# Patient Record
Sex: Male | Born: 1975 | Race: White | Hispanic: No | Marital: Single | Smoking: Never smoker
Health system: Southern US, Community
[De-identification: ages and names within clinical notes are randomized; demographics above are authoritative.]

## PROBLEM LIST (undated history)

## (undated) DIAGNOSIS — Z6825 Body mass index (BMI) 25.0-25.9, adult: Secondary | ICD-10-CM

## (undated) DIAGNOSIS — R9431 Abnormal electrocardiogram [ECG] [EKG]: Secondary | ICD-10-CM

## (undated) DIAGNOSIS — F419 Anxiety disorder, unspecified: Secondary | ICD-10-CM

## (undated) DIAGNOSIS — R911 Solitary pulmonary nodule: Secondary | ICD-10-CM

## (undated) DIAGNOSIS — R945 Abnormal results of liver function studies: Secondary | ICD-10-CM

## (undated) HISTORY — DX: Abnormal electrocardiogram (ECG) (EKG): R94.31

## (undated) HISTORY — DX: Anxiety disorder, unspecified: F41.9

## (undated) HISTORY — DX: Body mass index (BMI) 25.0-25.9, adult: Z68.25

## (undated) HISTORY — DX: Solitary pulmonary nodule: R91.1

## (undated) HISTORY — PX: HERNIA REPAIR: SHX51

## (undated) HISTORY — DX: Abnormal results of liver function studies: R94.5

---

## 1998-06-22 HISTORY — PX: HIP SURGERY: SHX245

## 2014-09-21 ENCOUNTER — Ambulatory Visit (INDEPENDENT_AMBULATORY_CARE_PROVIDER_SITE_OTHER): Payer: Self-pay | Admitting: Internal Medicine

## 2014-09-21 ENCOUNTER — Encounter: Payer: Self-pay | Admitting: Internal Medicine

## 2014-09-21 VITALS — BP 126/74 | HR 87 | Temp 98.7°F | Resp 17 | Ht 70.5 in | Wt 172.0 lb

## 2014-09-21 DIAGNOSIS — E039 Hypothyroidism, unspecified: Secondary | ICD-10-CM

## 2014-09-21 DIAGNOSIS — F411 Generalized anxiety disorder: Secondary | ICD-10-CM

## 2014-09-21 MED ORDER — CLONAZEPAM 1 MG PO TABS: 0.5000 mg | ORAL_TABLET | Freq: Two times a day (BID) | ORAL | Status: DC | PRN

## 2014-09-21 NOTE — Progress Notes (Signed)
Subjective:  This chart was scribed for Dr. Ellamae Siaobert Doolittle, MD  by Abel PrestoKara Demonbreun, ED Scribe. This patient was seen in room 10 and the patient's care was started at 1:05 PM.    Patient ID: Shawn Hutchinson, male    DOB: 11-04-1975, 39 y.o.   MRN: [id number]  HPI  Chief Complaint  Patient presents with   Medication Refill    clonopin    HPI Comments: Shawn SilvasJohn Hutchinson is a 39 y.o. male who presents to the [facility] complaining of medication refill of clonopin. Pt's PCP is at [facility]. Pt states PCP is unable to see him soon.Pt with h/o general anxiety disorder with onset in his 30s. Pt denies specific triggers of his panic attacks. Pt states panic attacks often happen at night disturbing pt's sleep. Pt takes melatonin to help him sleep but states it has not been working for him. Pt notes increase in recent family and work stressors. Pt is currently seeing a counselor and states it helps some. Pt has taken Zoloft in past. Pt takes synthroid but notes hypothyroidism is controlled.  Pt denies depression and dysphoric mood.   No past medical history on file.   No past surgical history on file.   History   Social History   Marital Status: Single    Spouse Name: N/A   Number of Children: N/A   Years of Education: N/A   Occupational History   Not on file.   Social History Main Topics   Smoking status: Never Smoker    Smokeless tobacco: Not on file   Alcohol Use: Not on file   Drug Use: Not on file   Sexual Activity: Not on file   Other Topics Concern   Not on file   Social History Narrative   No narrative on file   No Known Allergies    Current outpatient prescriptions:    levothyroxine (SYNTHROID, LEVOTHROID) 100 MCG tablet, Take 100 mcg by mouth daily before breakfast., Disp: , Rfl:    clonazePAM (KLONOPIN) 1 MG tablet, Take 0.5-1 tablets (0.5-1 mg total) by mouth 2 (two) times daily as needed for anxiety., Disp: 60 tablet, Rfl:  5   Review of Systems  Constitutional: Negative for activity change.  Psychiatric/Behavioral: Positive for sleep disturbance. Negative for dysphoric mood. The patient is nervous/anxious.       Objective:   Physical Exam  Constitutional: He is oriented to person, place, and time. He appears well-developed and well-nourished.  HENT:  Head: Normocephalic.  Eyes: Conjunctivae are normal.  Neck: Normal range of motion. Neck supple.  Pulmonary/Chest: Effort normal.  Musculoskeletal: Normal range of motion.  Neurological: He is alert and oriented to person, place, and time.  Skin: Skin is warm and dry.  Psychiatric: He has a normal mood and affect. His behavior is normal. Judgment and thought content normal.  Nursing note and vitals reviewed.     Filed Vitals:   09/21/14 1232  BP: 126/74  Pulse: 87  Temp: 98.7 F (37.1 C)  TempSrc: Oral  Resp: 17  Height: 5' 10.5" (1.791 m)  Weight: 172 lb (78.019 kg)  SpO2: 97%    Assessment & Plan:  GAD (generalized anxiety disorder) - Plan: clonazePAM (KLONOPIN) 1 MG tablet  He is currently doing well despite his inherited problems and needs occasional benzodiazepine to remain effective in daily life. Because he uses this on less than a One-A-Day average he should not be in danger of becoming tolerant. Because  he continues with counseling there is no need to prescribe SSRI at this point. He may follow-up here in 6 months and will consider transition to [place] for full care.     Meds ordered this encounter  Medications   levothyroxine (SYNTHROID, LEVOTHROID) 100 MCG tablet    Sig: Take 100 mcg by mouth daily before breakfast.   clonazePAM (KLONOPIN) 1 MG tablet    Sig: Take 0.5-1 tablets (0.5-1 mg total) by mouth 2 (two) times daily as needed for anxiety.    Dispense:  60 tablet    Refill:  5          I have completed the patient encounter in its entirety as documented by the scribe, with editing by me where necessary. Robert P.  Merla Riches, M.D.

## 2014-09-22 DIAGNOSIS — E039 Hypothyroidism, unspecified: Secondary | ICD-10-CM

## 2014-09-22 DIAGNOSIS — F411 Generalized anxiety disorder: Secondary | ICD-10-CM

## 2014-09-22 HISTORY — DX: Generalized anxiety disorder: F41.1

## 2014-09-22 HISTORY — DX: Hypothyroidism, unspecified: E03.9

## 2016-05-20 DIAGNOSIS — E782 Mixed hyperlipidemia: Secondary | ICD-10-CM

## 2016-05-20 HISTORY — DX: Mixed hyperlipidemia: E78.2

## 2018-12-30 DIAGNOSIS — E039 Hypothyroidism, unspecified: Secondary | ICD-10-CM

## 2018-12-30 DIAGNOSIS — K2921 Alcoholic gastritis with bleeding: Secondary | ICD-10-CM

## 2018-12-30 DIAGNOSIS — F418 Other specified anxiety disorders: Secondary | ICD-10-CM

## 2018-12-30 DIAGNOSIS — E873 Alkalosis: Secondary | ICD-10-CM

## 2018-12-30 DIAGNOSIS — E871 Hypo-osmolality and hyponatremia: Secondary | ICD-10-CM

## 2018-12-30 DIAGNOSIS — F1010 Alcohol abuse, uncomplicated: Secondary | ICD-10-CM

## 2018-12-30 DIAGNOSIS — F419 Anxiety disorder, unspecified: Secondary | ICD-10-CM

## 2018-12-30 DIAGNOSIS — R251 Tremor, unspecified: Secondary | ICD-10-CM

## 2019-05-04 DIAGNOSIS — R911 Solitary pulmonary nodule: Secondary | ICD-10-CM | POA: Insufficient documentation

## 2019-05-04 DIAGNOSIS — K4020 Bilateral inguinal hernia, without obstruction or gangrene, not specified as recurrent: Secondary | ICD-10-CM

## 2019-05-04 HISTORY — DX: Bilateral inguinal hernia, without obstruction or gangrene, not specified as recurrent: K40.20

## 2020-01-22 DIAGNOSIS — F332 Major depressive disorder, recurrent severe without psychotic features: Secondary | ICD-10-CM

## 2020-01-22 HISTORY — DX: Major depressive disorder, recurrent severe without psychotic features: F33.2

## 2020-01-24 DIAGNOSIS — F1020 Alcohol dependence, uncomplicated: Secondary | ICD-10-CM | POA: Insufficient documentation

## 2020-01-24 HISTORY — DX: Alcohol dependence, uncomplicated: F10.20

## 2020-01-25 DIAGNOSIS — M542 Cervicalgia: Secondary | ICD-10-CM

## 2020-01-25 HISTORY — DX: Cervicalgia: M54.2

## 2020-02-29 DIAGNOSIS — G992 Myelopathy in diseases classified elsewhere: Secondary | ICD-10-CM

## 2020-02-29 HISTORY — DX: Myelopathy in diseases classified elsewhere: G99.2

## 2020-05-28 ENCOUNTER — Other Ambulatory Visit: Payer: Self-pay | Admitting: Neurosurgery

## 2020-06-22 HISTORY — PX: ANTERIOR CERVICAL DISCECTOMY: SHX1160

## 2020-07-04 ENCOUNTER — Other Ambulatory Visit (HOSPITAL_COMMUNITY): Payer: Self-pay

## 2020-07-04 ENCOUNTER — Inpatient Hospital Stay (HOSPITAL_COMMUNITY): Admission: RE | Admit: 2020-07-04 | Payer: Self-pay | Source: Ambulatory Visit

## 2020-07-08 ENCOUNTER — Ambulatory Visit (HOSPITAL_COMMUNITY): Admission: RE | Admit: 2020-07-08 | Payer: 59 | Source: Home / Self Care | Admitting: Neurosurgery

## 2020-07-08 ENCOUNTER — Encounter (HOSPITAL_COMMUNITY): Admission: RE | Payer: Self-pay | Source: Home / Self Care

## 2020-07-08 SURGERY — ANTERIOR CERVICAL DECOMPRESSION/DISCECTOMY FUSION 2 LEVELS
Anesthesia: General

## 2020-07-30 ENCOUNTER — Encounter (INDEPENDENT_AMBULATORY_CARE_PROVIDER_SITE_OTHER): Payer: 59 | Admitting: Ophthalmology

## 2020-08-06 ENCOUNTER — Encounter (INDEPENDENT_AMBULATORY_CARE_PROVIDER_SITE_OTHER): Payer: 59 | Admitting: Ophthalmology

## 2020-08-14 DIAGNOSIS — I10 Essential (primary) hypertension: Secondary | ICD-10-CM | POA: Insufficient documentation

## 2020-08-14 HISTORY — DX: Essential (primary) hypertension: I10

## 2020-08-19 ENCOUNTER — Ambulatory Visit (INDEPENDENT_AMBULATORY_CARE_PROVIDER_SITE_OTHER): Payer: 59 | Admitting: Ophthalmology

## 2020-08-19 ENCOUNTER — Encounter (INDEPENDENT_AMBULATORY_CARE_PROVIDER_SITE_OTHER): Payer: Self-pay | Admitting: Ophthalmology

## 2020-08-19 ENCOUNTER — Other Ambulatory Visit: Payer: Self-pay

## 2020-08-19 DIAGNOSIS — H30001 Unspecified focal chorioretinal inflammation, right eye: Secondary | ICD-10-CM

## 2020-08-19 HISTORY — DX: Unspecified focal chorioretinal inflammation, right eye: H30.001

## 2020-08-19 MED ORDER — SULFAMETHOXAZOLE-TRIMETHOPRIM 800-160 MG PO TABS: 1.0000 | ORAL_TABLET | Freq: Two times a day (BID) | ORAL | 6 refills | Status: DC

## 2020-08-19 NOTE — Progress Notes (Signed)
08/19/2020     CHIEF COMPLAINT Patient presents for Retina Evaluation (NP subretinal lesion OD - Ref'd Dr. Bolivar Haw reports floaters OU off and on x 3 months. Pt reports flashes OD off and on x 3 months. Pt sts he feels OD pupil is larger than OS. Pt c/o blur OD x 1-2 months, while driving or looking at the computer. Pt sts VA OD is like looking through wax paper.)   HISTORY OF PRESENT ILLNESS: Shawn Hutchinson is a 45 y.o. male who presents to the clinic today for:   HPI    Retina Evaluation    In right eye.  This started 3 months ago.  Duration of 3 months.  Associated Symptoms Floaters.  Negative for Flashes.  Context:  distance vision and mid-range vision.  Treatments tried include no treatments. Additional comments: NP subretinal lesion OD - Ref'd Dr. Dione Booze  Pt reports floaters OU off and on x 3 months. Pt reports flashes OD off and on x 3 months. Pt sts he feels OD pupil is larger than OS. Pt c/o blur OD x 1-2 months, while driving or looking at the computer. Pt sts VA OD is like looking through wax paper.       Last edited by [name], COA on 08/19/2020  3:15 PM. (History)      Referring physician: [name], NP [address] [place],  [place] [address]  HISTORICAL INFORMATION:   Selected notes from the MEDICAL RECORD NUMBER       CURRENT MEDICATIONS: No current outpatient medications on file. (Ophthalmic Drugs)   No current facility-administered medications for this visit. (Ophthalmic Drugs)   Current Outpatient Medications (Other)  Medication Sig  . sulfamethoxazole-trimethoprim (BACTRIM DS) 800-160 MG tablet Take 1 tablet by mouth 2 (two) times daily.  . cholecalciferol (VITAMIN D3) 25 MCG (1000 UNIT) tablet Take 1,000 Units by mouth daily.  . folic acid (FOLVITE) 1 MG tablet Take 1 mg by mouth daily.  Marland Kitchen gabapentin (NEURONTIN) 100 MG capsule Take 100 mg by mouth 3 (three) times daily.  Marland Kitchen levothyroxine (SYNTHROID) 150 MCG tablet Take 150 mcg by  mouth daily before breakfast.  . melatonin 3 MG TABS tablet Take 3 mg by mouth at bedtime as needed (sleep).  . Multiple Vitamins-Minerals (MULTIVITAMIN WITH MINERALS) tablet Take 1 tablet by mouth daily.  . sertraline (ZOLOFT) 100 MG tablet Take 100 mg by mouth daily.  Marland Kitchen thiamine 100 MG tablet Take 100 mg by mouth daily.  . traZODone (DESYREL) 50 MG tablet Take 25-50 mg by mouth at bedtime.   No current facility-administered medications for this visit. (Other)      REVIEW OF SYSTEMS:    ALLERGIES No Known Allergies  PAST MEDICAL HISTORY History reviewed. No pertinent past medical history. History reviewed. No pertinent surgical history.  FAMILY HISTORY History reviewed. No pertinent family history.  SOCIAL HISTORY Social History   Tobacco Use  . Smoking status: Never Smoker  . Smokeless tobacco: Former Neurosurgeon         OPHTHALMIC EXAM:  Base Eye Exam    Visual Acuity (ETDRS)      Right Left   Dist [place] 20/20 20/20       Tonometry (Tonopen, 3:15 PM)      Right Left   Pressure 16 13       Pupils      Dark Light Shape React APD   Right 8 7 Round Brisk None   Left 7 6  Round Brisk None       Visual Fields (Counting fingers)      Left Right    Full Full       Extraocular Movement      Right Left    Full Full       Neuro/Psych    Oriented x3: Yes   Mood/Affect: Normal       Dilation    Both eyes: 1.0% Mydriacyl, 2.5% Phenylephrine @ 3:19 PM        Slit Lamp and Fundus Exam    External Exam      Right Left   External Normal Normal       Slit Lamp Exam      Right Left   Lids/Lashes Normal Normal   Conjunctiva/Sclera White and quiet White and quiet   Cornea Clear Clear   Anterior Chamber No cells in the anterior chamber Deep and quiet   Iris Round and reactive Round and reactive   Lens Trace Nuclear sclerosis Trace Nuclear sclerosis   Anterior Vitreous 2+ Cells Normal       Fundus Exam      Right Left   Posterior Vitreous Normal Normal    Disc Normal Normal   C/D Ratio 0.4 0.45   Macula Normal Normal   Vessels Overlying chorioretinal lesion superonasal, boxcarring of vein suggesting vasculitis secondary to retinal choroiditis Normal   Periphery 4 DA Size Retinochoroiditis, Superonasal to nerve  Normal          IMAGING AND PROCEDURES  Imaging and Procedures for 08/19/20  OCT, Retina - OU - Both Eyes       Right Eye Quality was good. Scan locations included subfoveal. Central Foveal Thickness: 287. Findings include normal foveal contour.   Left Eye Quality was good. Scan locations included subfoveal. Central Foveal Thickness: 281. Progression has no prior data. Findings include normal foveal contour.   Notes Vitreous debris prefoveal       Color Fundus Photography Optos - OU - Both Eyes       Right Eye Progression has been stable. Disc findings include normal observations. Macula : normal observations. Vessels : normal observations.   Left Eye Progression has been stable. Vessels : normal observations. Periphery : normal observations.   Notes Retinal choroiditis approximately 4-5 disc areas in size superonasal to the nerve, no an imminent threat to the nerve words functioning.                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  Nonetheless he has a history of some years ago having had a brief spell of having a pet cat.  He has had no previous documented retinochoroiditis in either eye.        ICD-10-CM   1. Focal chorioretinitis and focal retinochoroiditis, right  H30.001 OCT, Retina - OU - Both Eyes    Color Fundus Photography Optos - OU - Both Eyes    CBC With Differential    Toxoplasma gondii antibody, IgM    Toxocara Ab  (IgG), Serum    C-reactive protein    QuantiFERON-TB Gold Plus    Angiotensin converting enzyme    1.  Laboratory evaluation will be undertaken to look for IgG retinochoroiditis related to toxoplasmosis.  This is  the most likely clinical diagnosis nonetheless we will look for laboratory confirmation.  In the meantime we will use Bactrim DS 1 tablet twice daily for a period of 10 days with multiple refills should in fact the serology come back positive for this condition.  2.  3.  Ophthalmic Meds Ordered this visit:  Meds ordered this encounter  Medications  . sulfamethoxazole-trimethoprim (BACTRIM DS) 800-160 MG tablet    Sig: Take 1 tablet by mouth 2 (two) times daily.    Dispense:  20 tablet    Refill:  6       Return in about 2 weeks (around 09/02/2020) for dilate, OD, COLOR FP.  There are no Patient Instructions on file for this visit.   Explained the diagnoses, plan, and follow up with the patient and they expressed understanding.  Patient expressed understanding of the importance of proper follow up care.   Alford Highland Javon Hupfer M.D. Diseases & Surgery of the Retina and Vitreous [facility] 08/19/20     Abbreviations: M myopia (nearsighted); A astigmatism; H hyperopia (farsighted); P presbyopia; Mrx spectacle prescription;  CTL contact lenses; OD right eye; OS left eye; OU both eyes  XT exotropia; ET esotropia; PEK punctate epithelial keratitis; PEE punctate epithelial erosions; DES dry eye syndrome; MGD meibomian gland dysfunction; ATs artificial tears; PFAT's preservative free artificial tears; NSC nuclear sclerotic cataract; PSC posterior subcapsular cataract; ERM epi-retinal membrane; PVD posterior vitreous detachment; RD retinal detachment; DM diabetes mellitus; DR diabetic retinopathy; NPDR non-proliferative diabetic retinopathy; PDR proliferative diabetic retinopathy; CSME clinically significant macular edema; DME diabetic macular edema; dbh dot blot  hemorrhages; CWS cotton wool spot; POAG primary open angle glaucoma; C/D cup-to-disc ratio; HVF humphrey visual field; GVF goldmann visual field; OCT optical coherence tomography; IOP intraocular pressure; BRVO Branch retinal vein occlusion; CRVO central retinal vein occlusion; CRAO central retinal artery occlusion; BRAO branch retinal artery occlusion; RT retinal tear; SB scleral buckle; PPV pars plana vitrectomy; VH Vitreous hemorrhage; PRP panretinal laser photocoagulation; IVK intravitreal kenalog; VMT vitreomacular traction; MH Macular hole;  NVD neovascularization of the disc; NVE neovascularization elsewhere; AREDS age related eye disease study; ARMD age related macular degeneration; POAG primary open angle glaucoma; EBMD epithelial/anterior basement membrane dystrophy; ACIOL anterior chamber intraocular lens; IOL intraocular lens; PCIOL posterior chamber intraocular lens; Phaco/IOL phacoemulsification with intraocular lens placement; PRK photorefractive keratectomy; LASIK laser assisted in situ keratomileusis; HTN hypertension; DM diabetes mellitus; COPD chronic obstructive pulmonary disease

## 2020-08-19 NOTE — Assessment & Plan Note (Signed)
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  Nonetheless he has a history of some years ago having had a brief spell of having a pet cat.  He has had no previous documented retinochoroiditis in either eye.

## 2020-08-20 ENCOUNTER — Other Ambulatory Visit (INDEPENDENT_AMBULATORY_CARE_PROVIDER_SITE_OTHER): Payer: Self-pay | Admitting: Ophthalmology

## 2020-08-20 HISTORY — PX: HERNIA REPAIR: SHX51

## 2020-08-25 ENCOUNTER — Encounter (INDEPENDENT_AMBULATORY_CARE_PROVIDER_SITE_OTHER): Payer: Self-pay | Admitting: Ophthalmology

## 2020-09-02 ENCOUNTER — Encounter (INDEPENDENT_AMBULATORY_CARE_PROVIDER_SITE_OTHER): Payer: 59 | Admitting: Ophthalmology

## 2020-09-02 NOTE — Progress Notes (Signed)
The results were Toxocara was negative as seen and should be scanned into result  [facility] did not post directly

## 2020-09-05 ENCOUNTER — Encounter (INDEPENDENT_AMBULATORY_CARE_PROVIDER_SITE_OTHER): Payer: 59 | Admitting: Ophthalmology

## 2020-09-12 ENCOUNTER — Encounter (INDEPENDENT_AMBULATORY_CARE_PROVIDER_SITE_OTHER): Payer: Self-pay | Admitting: Ophthalmology

## 2020-09-12 ENCOUNTER — Other Ambulatory Visit: Payer: Self-pay

## 2020-09-12 ENCOUNTER — Ambulatory Visit (INDEPENDENT_AMBULATORY_CARE_PROVIDER_SITE_OTHER): Payer: 59 | Admitting: Ophthalmology

## 2020-09-12 DIAGNOSIS — H30001 Unspecified focal chorioretinal inflammation, right eye: Secondary | ICD-10-CM | POA: Diagnosis not present

## 2020-09-12 DIAGNOSIS — H43391 Other vitreous opacities, right eye: Secondary | ICD-10-CM

## 2020-09-12 DIAGNOSIS — B5801 Toxoplasma chorioretinitis: Secondary | ICD-10-CM

## 2020-09-12 HISTORY — DX: Other vitreous opacities, right eye: H43.391

## 2020-09-12 HISTORY — DX: Toxoplasma chorioretinitis: B58.01

## 2020-09-12 NOTE — Assessment & Plan Note (Signed)
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

## 2020-09-12 NOTE — Assessment & Plan Note (Signed)
Observe, if no clearance substantially over the coming months, I did explain upon is questioning that surgical intervention to clear the floaters is possible but usually not required

## 2020-09-12 NOTE — Progress Notes (Signed)
09/12/2020     CHIEF COMPLAINT Patient presents for Retina Follow Up (3 Wk F/U OD//Pt reports mild increased blur OD since last visit. Pt reports increased floaters OD. Pt denies changes OS.)   HISTORY OF PRESENT ILLNESS: Shawn Hutchinson is a 45 y.o. male who presents to the clinic today for:   HPI    Retina Follow Up    Patient presents with  Other.  In right eye.  This started 3 weeks ago.  Severity is mild.  Duration of 3 weeks.  Since onset it is gradually worsening. Additional comments: 3 Wk F/U OD  Pt reports mild increased blur OD since last visit. Pt reports increased floaters OD. Pt denies changes OS.       Last edited by [name], COA on 09/12/2020  1:46 PM. (History)      Referring physician: [name] [address] [place],  [place] [address]  HISTORICAL INFORMATION:   Selected notes from the MEDICAL RECORD NUMBER       CURRENT MEDICATIONS: No current outpatient medications on file. (Ophthalmic Drugs)   No current facility-administered medications for this visit. (Ophthalmic Drugs)   Current Outpatient Medications (Other)  Medication Sig  . cholecalciferol (VITAMIN D3) 25 MCG (1000 UNIT) tablet Take 1,000 Units by mouth daily.  . folic acid (FOLVITE) 1 MG tablet Take 1 mg by mouth daily.  Marland Kitchen gabapentin (NEURONTIN) 100 MG capsule Take 100 mg by mouth 3 (three) times daily.  Marland Kitchen levothyroxine (SYNTHROID) 150 MCG tablet Take 150 mcg by mouth daily before breakfast.  . melatonin 3 MG TABS tablet Take 3 mg by mouth at bedtime as needed (sleep).  . Multiple Vitamins-Minerals (MULTIVITAMIN WITH MINERALS) tablet Take 1 tablet by mouth daily.  . sertraline (ZOLOFT) 100 MG tablet Take 100 mg by mouth daily.  Marland Kitchen sulfamethoxazole-trimethoprim (BACTRIM DS) 800-160 MG tablet Take 1 tablet by mouth 2 (two) times daily.  Marland Kitchen thiamine 100 MG tablet Take 100 mg by mouth daily.  . traZODone (DESYREL) 50 MG tablet Take 25-50 mg by mouth at bedtime.   No  current facility-administered medications for this visit. (Other)      REVIEW OF SYSTEMS:    ALLERGIES No Known Allergies  PAST MEDICAL HISTORY History reviewed. No pertinent past medical history. History reviewed. No pertinent surgical history.  FAMILY HISTORY History reviewed. No pertinent family history.  SOCIAL HISTORY Social History   Tobacco Use  . Smoking status: Never Smoker  . Smokeless tobacco: Former Neurosurgeon         OPHTHALMIC EXAM: Base Eye Exam    Visual Acuity (ETDRS)      Right Left   Dist [place] 20/20 -1 20/20       Tonometry (Tonopen, 1:46 PM)      Right Left   Pressure 15 11       Pupils      Pupils Dark Light Shape React APD   Right PERRL 8 7 Round Brisk None   Left PERRL 8 7 Round Brisk None       Visual Fields (Counting fingers)      Left Right    Full Full       Extraocular Movement      Right Left    Full Full       Neuro/Psych    Oriented x3: Yes   Mood/Affect: Normal       Dilation    Right eye: 1.0% Mydriacyl, 2.5% Phenylephrine @ 1:50  PM        Slit Lamp and Fundus Exam    External Exam      Right Left   External Normal Normal       Slit Lamp Exam      Right Left   Lids/Lashes Normal Normal   Conjunctiva/Sclera White and quiet White and quiet   Cornea Clear Clear   Anterior Chamber No cells in the anterior chamber Deep and quiet   Iris Round and reactive Round and reactive   Lens Trace Nuclear sclerosis Trace Nuclear sclerosis   Anterior Vitreous 2+ Cells Normal       Fundus Exam      Right Left   Posterior Vitreous Normal    Disc Normal    C/D Ratio 0.4    Macula Normal    Vessels Overlying chorioretinal lesion superonasal, boxcarring of vein suggesting vasculitis secondary to retinal choroiditis,     Periphery 4 DA Size Retinochoroiditis, Superonasal to nerve            IMAGING AND PROCEDURES  Imaging and Procedures for 09/12/20  Color Fundus Photography Optos - OU - Both Eyes       Right  Eye Progression has been stable. Disc findings include normal observations. Macula : normal observations. Vessels : normal observations.   Left Eye Progression has been stable. Vessels : normal observations. Periphery : normal observations.   Notes Retinal choroiditis approximately 4-5 disc areas in size superonasal to the nerve, much less active, less retinal whitening, less Bock scarring of the retinal vasculature in this region no an imminent threat to the nerve words functioning.  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 Toxoplasmosis chorioretinitis of right eye  B58.01     1.  Chorioretinitis superonasal quadrant to the nerve with no impact on nerve or macular functioning yet with secondary vitreous debris  2.  Patient instructed to complete the Bactrim DS 1 tablet twice daily for the next 3 weeks and then be off the medication 1 week prior to follow-up here in 4 weeks  3.  Ophthalmic Meds Ordered this visit:  No orders of the defined types were placed in this encounter.      Return in about 4 weeks (around 10/10/2020) for dilate, OD, COLOR FP.  There are no Patient Instructions on file for this visit.   Explained the diagnoses, plan, and follow up with the patient and they expressed understanding.  Patient expressed understanding of the importance of proper follow up care.   [name] M.D. Diseases & Surgery of the Retina and Vitreous [facility] 09/12/20     Abbreviations: M myopia (nearsighted); A astigmatism; H hyperopia (farsighted); P presbyopia; Mrx spectacle prescription;  CTL contact lenses;  OD right eye; OS left eye; OU both eyes  XT exotropia; ET esotropia; PEK punctate epithelial keratitis; PEE punctate epithelial erosions; DES dry eye syndrome; MGD meibomian gland dysfunction; ATs artificial tears; PFAT's preservative free artificial tears; NSC nuclear sclerotic cataract; PSC posterior subcapsular cataract; ERM epi-retinal membrane; PVD posterior vitreous detachment; RD retinal detachment; DM diabetes mellitus; DR  diabetic retinopathy; NPDR non-proliferative diabetic retinopathy; PDR proliferative diabetic retinopathy; CSME clinically significant macular edema; DME diabetic macular edema; dbh dot blot hemorrhages; CWS cotton wool spot; POAG primary open angle glaucoma; C/D cup-to-disc ratio; HVF humphrey visual field; GVF goldmann visual field; OCT optical coherence tomography; IOP intraocular pressure; BRVO Branch retinal vein occlusion; CRVO central retinal vein occlusion; CRAO central retinal artery occlusion; BRAO branch retinal artery occlusion; RT retinal tear; SB scleral buckle; PPV pars plana vitrectomy; VH Vitreous hemorrhage; PRP panretinal laser photocoagulation; IVK intravitreal kenalog; VMT vitreomacular traction; MH Macular hole;  NVD neovascularization of the disc; NVE neovascularization elsewhere; AREDS age related eye disease study; ARMD age related macular degeneration; POAG primary open angle glaucoma; EBMD epithelial/anterior basement membrane dystrophy; ACIOL anterior chamber intraocular lens; IOL intraocular lens; PCIOL posterior chamber intraocular lens; Phaco/IOL phacoemulsification with intraocular lens placement; [place] photorefractive keratectomy; LASIK laser assisted in situ keratomileusis; HTN hypertension; DM diabetes mellitus; COPD chronic obstructive pulmonary disease

## 2020-10-09 ENCOUNTER — Other Ambulatory Visit: Payer: Self-pay

## 2020-10-09 ENCOUNTER — Ambulatory Visit (INDEPENDENT_AMBULATORY_CARE_PROVIDER_SITE_OTHER): Payer: 59 | Admitting: Ophthalmology

## 2020-10-09 ENCOUNTER — Encounter (INDEPENDENT_AMBULATORY_CARE_PROVIDER_SITE_OTHER): Payer: Self-pay | Admitting: Ophthalmology

## 2020-10-09 DIAGNOSIS — H30001 Unspecified focal chorioretinal inflammation, right eye: Secondary | ICD-10-CM

## 2020-10-09 DIAGNOSIS — B5801 Toxoplasma chorioretinitis: Secondary | ICD-10-CM | POA: Diagnosis not present

## 2020-10-09 NOTE — Assessment & Plan Note (Signed)
Toxoplasmosis retinochoroiditis now completely involutional superonasal quadrant of the fundus right I near mid periphery.  Patient off medication, oral antibiotics now for last 7 days.  Follow-up.  As needed and follow-up completely with Dr. Thayer Ohm or Wallie Renshaw in the future

## 2020-10-09 NOTE — Patient Instructions (Signed)
Patient instructed to maintain and follow-up with [facility] on a routine basis or on a as needed basis if new troubles were to develop

## 2020-10-09 NOTE — Assessment & Plan Note (Signed)
Condition resolved now at 2 months from onset, with Bactrim antiprotozoal therapy

## 2020-10-09 NOTE — Progress Notes (Signed)
10/09/2020     CHIEF COMPLAINT Patient presents for Retina Follow Up (4wk fu OD/Pt states,"I still have some blurriness OD but I can tell that it is resolving and getting better for sure. Bright lights still kind of pose an issue for me.")   HISTORY OF PRESENT ILLNESS: Shawn Hutchinson is a 44 y.o. male who presents to the clinic today for:   HPI    Retina Follow Up    Patient presents with  Other.  In right eye.  This started 4 weeks ago.  Severity is mild.  Duration of 4 weeks.  Since onset it is gradually improving. Additional comments: 4wk fu OD Pt states,"I still have some blurriness OD but I can tell that it is resolving and getting better for sure. Bright lights still kind of pose an issue for me."       Last edited by Demetrios Loll, COA on 10/09/2020  2:28 PM. (History)      Referring physician: [name], NP [address] [place],  [place] [address]  HISTORICAL INFORMATION:   Selected notes from the MEDICAL RECORD NUMBER       CURRENT MEDICATIONS: No current outpatient medications on file. (Ophthalmic Drugs)   No current facility-administered medications for this visit. (Ophthalmic Drugs)   Current Outpatient Medications (Other)  Medication Sig  . cholecalciferol (VITAMIN D3) 25 MCG (1000 UNIT) tablet Take 1,000 Units by mouth daily.  . folic acid (FOLVITE) 1 MG tablet Take 1 mg by mouth daily.  Marland Kitchen gabapentin (NEURONTIN) 100 MG capsule Take 100 mg by mouth 3 (three) times daily.  Marland Kitchen levothyroxine (SYNTHROID) 150 MCG tablet Take 150 mcg by mouth daily before breakfast.  . melatonin 3 MG TABS tablet Take 3 mg by mouth at bedtime as needed (sleep).  . Multiple Vitamins-Minerals (MULTIVITAMIN WITH MINERALS) tablet Take 1 tablet by mouth daily.  . sertraline (ZOLOFT) 100 MG tablet Take 100 mg by mouth daily.  Marland Kitchen thiamine 100 MG tablet Take 100 mg by mouth daily.  . traZODone (DESYREL) 50 MG tablet Take 25-50 mg by mouth at bedtime.   No current facility-administered  medications for this visit. (Other)      REVIEW OF SYSTEMS:    ALLERGIES No Known Allergies  PAST MEDICAL HISTORY History reviewed. No pertinent past medical history. History reviewed. No pertinent surgical history.  FAMILY HISTORY History reviewed. No pertinent family history.  SOCIAL HISTORY Social History   Tobacco Use  . Smoking status: Never Smoker  . Smokeless tobacco: Former Neurosurgeon         OPHTHALMIC EXAM:  Base Eye Exam    Visual Acuity (ETDRS)      Right Left   Dist [place] 20/20 20/20       Tonometry (Tonopen, 2:31 PM)      Right Left   Pressure 12 11       Pupils      Pupils Dark Light Shape React APD   Right PERRL 7 6 Round Brisk None   Left PERRL 7 6 Round Brisk None       Visual Fields (Counting fingers)      Left Right    Full Full       Extraocular Movement      Right Left    Full Full       Neuro/Psych    Oriented x3: Yes   Mood/Affect: Normal       Dilation    Right eye: 1.0% Mydriacyl, 2.5%  Phenylephrine @ 2:31 PM        Slit Lamp and Fundus Exam    External Exam      Right Left   External Normal Normal       Slit Lamp Exam      Right Left   Lids/Lashes Normal Normal   Conjunctiva/Sclera White and quiet White and quiet   Cornea Clear Clear   Anterior Chamber No cells in the anterior chamber Deep and quiet   Iris Round and reactive Round and reactive   Lens Trace Nuclear sclerosis Trace Nuclear sclerosis   Anterior Vitreous no cells Normal       Fundus Exam      Right Left   Posterior Vitreous ,, Central vitreous floaters    Disc Normal    C/D Ratio 0.4    Macula Normal    Vessels Overlying chorioretinal lesion superonasal, normal vasculature, 4-5 disc area size flat chorioretinal scar with no active lesion no active cells no active debris no active margins superonasal to the nerve    Periphery 4 DA Size Retinochoroiditis, Superonasal to nerve            IMAGING AND PROCEDURES  Imaging and Procedures for  10/09/20  Color Fundus Photography Optos - OU - Both Eyes       Right Eye Progression has improved. Disc findings include normal observations. Macula : normal observations. Vessels : normal observations.   Left Eye Progression has been stable. Vessels : normal observations. Periphery : normal observations.   Notes Retinal choroiditis approximately 4-5 disc areas in size superonasal to the nerve, now resolved, no active retinitis and no vascular changes  in this region no an imminent threat to the nerve.  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  Patient off medication, oral antibiotics now for last 7 days.  Follow-up.  As needed and follow-up completely with Dr. Thayer Ohm or Wallie Renshaw in the future  Focal chorioretinitis and focal retinochoroiditis, right Condition resolved now at 2 months from onset, with Bactrim antiprotozoal therapy      ICD-10-CM   1. 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 Toxoplasmosis chorioretinitis of right eye  B58.01     1.  Patient has completed oral antibiotics, and has suffered no consequences such as antibiotic associated diarrhea.  Visual acuity has cleared although he still has some central floaters I explained that this is likely to persist although may improved over time  2.  Patient to report any new onset visual acuity declines or distortions  3.  Patient be should be seen in follow-up for routine eye care with [facility] within 2 years or as needed  Ophthalmic Meds Ordered this visit:  No orders of the defined types were placed in this encounter.      Return if symptoms worsen or fail to improve.  Patient Instructions  Patient instructed to maintain  and follow-up with [facility] on a routine basis or on a as needed basis if new troubles were to develop      Explained the diagnoses, plan, and follow up with the patient and they expressed understanding.  Patient expressed understanding of the importance of proper follow up  care.   Alford Highland. Cailyn Houdek M.D. Diseases & Surgery of the Retina and Vitreous [facility] 10/09/20     Abbreviations: M myopia (nearsighted); A astigmatism; H hyperopia (farsighted); P presbyopia; Mrx spectacle prescription;  CTL contact lenses; OD right eye; OS left eye; OU both eyes  XT exotropia; ET esotropia; PEK punctate epithelial keratitis; PEE punctate epithelial erosions; DES dry eye syndrome; MGD meibomian gland dysfunction; ATs artificial tears; PFAT's preservative free artificial tears; NSC nuclear sclerotic cataract; PSC posterior subcapsular cataract; ERM epi-retinal membrane; PVD posterior vitreous detachment; RD retinal detachment; DM diabetes mellitus; DR diabetic retinopathy; NPDR non-proliferative diabetic retinopathy; PDR proliferative diabetic retinopathy; CSME clinically significant macular edema; DME diabetic macular edema; dbh dot blot hemorrhages; CWS cotton wool spot; POAG primary open angle glaucoma; C/D cup-to-disc ratio; HVF humphrey visual field; GVF goldmann visual field; OCT optical coherence tomography; IOP intraocular pressure; BRVO Branch retinal vein occlusion; CRVO central retinal vein occlusion; CRAO central retinal artery occlusion; BRAO branch retinal artery occlusion; RT retinal tear; SB scleral buckle; PPV pars plana vitrectomy; VH Vitreous hemorrhage; PRP panretinal laser photocoagulation; IVK intravitreal kenalog; VMT vitreomacular traction; MH Macular hole;  NVD neovascularization of the disc; NVE neovascularization elsewhere; AREDS age related eye disease study; ARMD age related macular degeneration; POAG primary open angle glaucoma; EBMD epithelial/anterior  basement membrane dystrophy; ACIOL anterior chamber intraocular lens; IOL intraocular lens; PCIOL posterior chamber intraocular lens; Phaco/IOL phacoemulsification with intraocular lens placement; PRK photorefractive keratectomy; LASIK laser assisted in situ keratomileusis; HTN hypertension; DM diabetes mellitus; COPD chronic obstructive pulmonary disease

## 2020-11-26 ENCOUNTER — Other Ambulatory Visit: Payer: Self-pay

## 2020-11-26 DIAGNOSIS — F419 Anxiety disorder, unspecified: Secondary | ICD-10-CM

## 2020-11-26 DIAGNOSIS — R945 Abnormal results of liver function studies: Secondary | ICD-10-CM | POA: Insufficient documentation

## 2020-11-26 DIAGNOSIS — Z6825 Body mass index (BMI) 25.0-25.9, adult: Secondary | ICD-10-CM

## 2020-12-16 ENCOUNTER — Telehealth (INDEPENDENT_AMBULATORY_CARE_PROVIDER_SITE_OTHER): Payer: Self-pay

## 2020-12-16 NOTE — Telephone Encounter (Signed)
SWP and told him that in order for him to have specifi

## 2020-12-27 ENCOUNTER — Ambulatory Visit (INDEPENDENT_AMBULATORY_CARE_PROVIDER_SITE_OTHER): Payer: 59 | Admitting: Cardiology

## 2020-12-27 ENCOUNTER — Encounter: Payer: Self-pay | Admitting: Cardiology

## 2020-12-27 ENCOUNTER — Other Ambulatory Visit: Payer: Self-pay

## 2020-12-27 VITALS — BP 108/78 | HR 71 | Resp 16 | Ht 71.0 in | Wt 187.6 lb

## 2020-12-27 DIAGNOSIS — E782 Mixed hyperlipidemia: Secondary | ICD-10-CM | POA: Diagnosis not present

## 2020-12-27 DIAGNOSIS — I259 Chronic ischemic heart disease, unspecified: Secondary | ICD-10-CM

## 2020-12-27 DIAGNOSIS — I209 Angina pectoris, unspecified: Secondary | ICD-10-CM

## 2020-12-27 DIAGNOSIS — F1010 Alcohol abuse, uncomplicated: Secondary | ICD-10-CM

## 2020-12-27 HISTORY — DX: Alcohol abuse, uncomplicated: F10.10

## 2020-12-27 HISTORY — DX: Angina pectoris, unspecified: I20.9

## 2020-12-27 MED ORDER — NITROGLYCERIN 0.4 MG SL SUBL: 0.4000 mg | SUBLINGUAL_TABLET | SUBLINGUAL | 6 refills | Status: DC | PRN

## 2020-12-27 MED ORDER — ASPIRIN EC 81 MG PO TBEC: 81.0000 mg | DELAYED_RELEASE_TABLET | Freq: Every day | ORAL | 3 refills | Status: DC

## 2020-12-27 MED ORDER — METOPROLOL TARTRATE 100 MG PO TABS: 100.0000 mg | ORAL_TABLET | Freq: Once | ORAL | 0 refills | Status: DC

## 2020-12-27 NOTE — Addendum Note (Signed)
Addended by: Eleonore Chiquito on: 12/27/2020 04:16 PM   Modules accepted: Orders

## 2020-12-27 NOTE — Patient Instructions (Signed)
Medication Instructions:  Your physician has recommended you make the following change in your medication:   Take 81 mg coated aspirin daily.  .Use nitroglycerin 1 tablet placed under the tongue at the first sign of chest pain or an angina attack. 1 tablet may be used every 5 minutes as needed, for up to 15 minutes. Do not take more than 3 tablets in 15 minutes. If pain persist call 911 or go to the nearest ED.   *If you need a refill on your cardiac medications before your next appointment, please call your pharmacy*   Lab Work: None ordered If you have labs (blood work) drawn today and your tests are completely normal, you will receive your results only by: [place] (if you have MyChart) OR A paper copy in the mail If you have any lab test that is abnormal or we need to change your treatment, we will call you to review the results.   Testing/Procedures: Your cardiac CT will be scheduled at:   [facility] [place], [place] [address] [phone]   If scheduled at [facility], please arrive at the [facility] main entrance of [facility] 30 minutes prior to test start time. Proceed to the [facility] Radiology Department (first floor) to check-in and test prep.  Please follow these instructions carefully (unless otherwise directed):  Hold all erectile dysfunction medications at least 3 days (72 hrs) prior to test.  On the Night Before the Test: Be sure to Drink plenty of water. Do not consume any caffeinated/decaffeinated beverages or chocolate 12 hours prior to your test. Do not take any antihistamines 12 hours prior to your test.   On the Day of the Test: Drink plenty of water. Do not drink any water within one hour of the test. Do not eat any food 4 hours prior to the test. You may take your regular medications prior to the test.  Take metoprolol (Lopressor) two hours prior to test.       After the Test: Drink plenty  of water. After receiving IV contrast, you may experience a mild flushed feeling. This is normal. On occasion, you may experience a mild rash up to 24 hours after the test. This is not dangerous. If this occurs, you can take Benadryl 25 mg and increase your fluid intake. If you experience trouble breathing, this can be serious. If it is severe call 911 IMMEDIATELY. If it is mild, please call our office. If you take any of these medications: Glipizide/Metformin, Avandament, Glucavance, please do not take 48 hours after completing test unless otherwise instructed.   Once we have confirmed authorization from your insurance company, we will call you to set up a date and time for your test. Based on how quickly your insurance processes prior authorizations requests, please allow up to 4 weeks to be contacted for scheduling your Cardiac CT appointment. Be advised that routine Cardiac CT appointments could be scheduled as many as 8 weeks after your provider has ordered it.  For non-scheduling related questions, please contact the cardiac imaging nurse navigator should you have any questions/concerns: Marchia Bond, Cardiac Imaging Nurse Navigator Burley Saver, Interim Cardiac Imaging Nurse [facility] Direct Office Dial: [phone]   For scheduling needs, including cancellations and rescheduling, please call Vivien Rota at [phone].     Follow-Up: At [facility], you and your health needs are our priority.  As part of our continuing mission to provide  you with exceptional heart care, we have created designated Provider Care Teams.  These Care Teams include your primary Cardiologist (physician) and Advanced Practice Providers (APPs -  Physician Assistants and Nurse Practitioners) who all work together to provide you with the care you need, when you need it.  We recommend signing up for the patient portal called "MyChart".  Sign up information is provided on this After  Visit Summary.  MyChart is used to connect with patients for Virtual Visits (Telemedicine).  Patients are able to view lab/test results, encounter notes, upcoming appointments, etc.  Non-urgent messages can be sent to your provider as well.   To learn more about what you can do with MyChart, go to [email or web address].    Your next appointment:   1 month(s)  The format for your next appointment:   In Person  Provider:   Jyl Heinz, MD   Other Instructions Cardiac CT Angiogram A cardiac CT angiogram is a procedure to look at the heart and the area around the heart. It may be done to help find the cause of chest pains or other symptoms of heart disease. During this procedure, a substance called contrast dye is injected into the blood vessels in the area to be checked. A large X-ray machine, called a CT scanner, then takes detailed pictures of the heart and the surrounding area. The procedure is also sometimes called a coronary CT angiogram, coronary artery scanning, or CTA. A cardiac CT angiogram allows the health care provider to see how well blood is flowing to and from the heart. The health care provider will be able to see if there are any problems, such as: Blockage or narrowing of the coronary arteries in the heart. Fluid around the heart. Signs of weakness or disease in the muscles, valves, and tissues of the heart. Tell a health care provider about: Any allergies you have. This is especially important if you have had a previous allergic reaction to contrast dye. All medicines you are taking, including vitamins, herbs, eye drops, creams, and over-the-counter medicines. Any blood disorders you have. Any surgeries you have had. Any medical conditions you have. Whether you are pregnant or may be pregnant. Any anxiety disorders, chronic pain, or other conditions you have that may increase your stress or prevent you from lying still. What are the risks? Generally, this is a safe  procedure. However, problems may occur, including: Bleeding. Infection. Allergic reactions to medicines or dyes. Damage to other structures or organs. Kidney damage from the contrast dye that is used. Increased risk of cancer from radiation exposure. This risk is low. Talk with your health care provider about: The risks and benefits of testing. How you can receive the lowest dose of radiation. What happens before the procedure? Wear comfortable clothing and remove any jewelry, glasses, dentures, and hearing aids. Follow instructions from your health care provider about eating and drinking. This may include: For 12 hours before the procedure -- avoid caffeine. This includes tea, coffee, soda, energy drinks, and diet pills. Drink plenty of water or other fluids that do not have caffeine in them. Being well hydrated can prevent complications. For 4-6 hours before the procedure -- stop eating and drinking. The contrast dye can cause nausea, but this is less likely if your stomach is empty. Ask your health care provider about changing or stopping your regular medicines. This is especially important if you are taking diabetes medicines, blood thinners, or medicines to treat problems with erections (erectile dysfunction). What happens  during the procedure?  Hair on your chest may need to be removed so that small sticky patches called electrodes can be placed on your chest. These will transmit information that helps to monitor your heart during the procedure. An IV will be inserted into one of your veins. You might be given a medicine to control your heart rate during the procedure. This will help to ensure that good images are obtained. You will be asked to lie on an exam table. This table will slide in and out of the CT machine during the procedure. Contrast dye will be injected into the IV. You might feel warm, or you may get a metallic taste in your mouth. You will be given a medicine called  nitroglycerin. This will relax or dilate the arteries in your heart. The table that you are lying on will move into the CT machine tunnel for the scan. The person running the machine will give you instructions while the scans are being done. You may be asked to: Keep your arms above your head. Hold your breath. Stay very still, even if the table is moving. When the scanning is complete, you will be moved out of the machine. The IV will be removed. The procedure may vary among health care providers and hospitals. What can I expect after the procedure? After your procedure, it is common to have: A metallic taste in your mouth from the contrast dye. A feeling of warmth. A headache from the nitroglycerin. Follow these instructions at home: Take over-the-counter and prescription medicines only as told by your health care provider. If you are told, drink enough fluid to keep your urine pale yellow. This will help to flush the contrast dye out of your body. Most people can return to their normal activities right after the procedure. Ask your health care provider what activities are safe for you. It is up to you to get the results of your procedure. Ask your health care provider, or the department that is doing the procedure, when your results will be ready. Keep all follow-up visits as told by your health care provider. This is important. Contact a health care provider if: You have any symptoms of allergy to the contrast dye. These include: Shortness of breath. Rash or hives. A racing heartbeat. Summary A cardiac CT angiogram is a procedure to look at the heart and the area around the heart. It may be done to help find the cause of chest pains or other symptoms of heart disease. During this procedure, a large X-ray machine, called a CT scanner, takes detailed pictures of the heart and the surrounding area after a contrast dye has been injected into blood vessels in the area. Ask your health care  provider about changing or stopping your regular medicines before the procedure. This is especially important if you are taking diabetes medicines, blood thinners, or medicines to treat erectile dysfunction. If you are told, drink enough fluid to keep your urine pale yellow. This will help to flush the contrast dye out of your body. This information is not intended to replace advice given to you by your health care provider. Make sure you discuss any questions you have with your health care provider. Document Revised: 02/01/2019 Document Reviewed: 02/01/2019 Elsevier Patient Education  [place].

## 2020-12-27 NOTE — Progress Notes (Signed)
Cardiology Office Note:    Date:  12/27/2020   ID:  Shawn Hutchinson, DOB 11-28-1975, MRN [id number]  PCP:  Shawn Like, NP  Cardiologist:  Shawn Brothers, MD   Referring MD: Shawn Like, NP    ASSESSMENT:    1. Mixed hyperlipidemia   2. Angina pectoris (HCC)   3. Alcohol abuse    PLAN:    In order of problems listed above:  Primary prevention stressed with the patient.  Importance of compliance with diet medication stressed any vocalized understanding. Angina pectoris: Patient's symptoms are significant.  Following recommendations were made today.  He was advised to take a coated baby aspirin on a daily basis.  Sublingual nitroglycerin prescription was sent, its protocol and 911 protocol explained and the patient vocalized understanding questions were answered to the patient's satisfaction.  I given option of invasive and noninvasive evaluation and finally he picked CT coronary angiography FFR.  Patient was advised not to exert himself and to rest until this evaluation. Mixed dyslipidemia: Diet was emphasized and lipids are followed by primary care provider.  The above test will help to manage risk management. History of alcohol use: I told him to be very careful and to stay away from alcohol and he promises to do better.  This will be managed by primary care. Patient will be seen in follow-up appointment in 6 months or earlier if the patient has any concerns    Medication Adjustments/Labs and Tests Ordered: Current medicines are reviewed at length with the patient today.  Concerns regarding medicines are outlined above.  No orders of the defined types were placed in this encounter.  No orders of the defined types were placed in this encounter.    History of Present Illness:    Shawn Hutchinson is a 45 y.o. male who is being seen today for the evaluation of patient has past medical history of mixed dyslipidemia and alcohol abuse.  He has had a fall several months ago with  significant severe injury to the neck and subsequent surgery.  He says made him use significant alcohol according to the history provided by the patient.  He is seen at the request of Shawn Like, NP.  Patient mentions to me that he has chest tightness on exertion or stress.  No orthopnea or PND.  This is concerning to him because it people repeatedly according with exertion.  He is sexually active to some extent and sexual activity does not bring around the symptoms.  No radiation to the neck or to the arms.  At the time of my evaluation, the patient is alert awake oriented and in no distress.  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    Past Surgical History:  Procedure Laterality Date   HERNIA REPAIR      Current Medications: No outpatient medications have been marked as taking for the 12/27/20 encounter (Office Visit) with Shawn Hutchinson, Shawn Dubin, MD.     Allergies:   Patient has no known allergies.    Social History   Socioeconomic History   Marital status: Single    Spouse name: Not on file   Number of children: Not on file   Years of education: Not on file   Highest education level: Not on file  Occupational History   Not on file  Tobacco Use   Smoking status: Never   Smokeless tobacco: Former  Substance and Sexual Activity   Alcohol use: Not on file   Drug use: Not on file   Sexual activity: Not on file  Other Topics Concern   Not on file  Social History Narrative   Not on file   Social Determinants of Health   Financial Resource Strain: Not on file  Food Insecurity: Not on file  Transportation Needs: Not on file  Physical Activity: Not on file  Stress: Not on file  Social Connections: Not on file     Family History: The patient's family history includes Pancreatic cancer in an other family member.  ROS:   Please see the history of present illness.    All other systems reviewed and are negative.  EKGs/Labs/Other Studies Reviewed:    The following studies were reviewed today: EKG reveals sinus rhythm and nonspecific ST-T changes   Recent Labs: No results found for requested labs within last 8760 hours.  Recent Lipid Panel No results found for: CHOL, TRIG, HDL, CHOLHDL, VLDL, LDLCALC, LDLDIRECT  Physical Exam:    VS:  BP 108/78 (BP Location: Right Arm, Patient Position: Sitting, Cuff Size: Normal)   Pulse 71   Resp 16   Ht 5\' 11"  (1.803 m)   Wt 187 lb 9.6 oz (85.1 kg)   SpO2 98%   BMI 26.16 kg/m     Wt Readings from Last 3 Encounters:  12/27/20 187 lb 9.6 oz (85.1 kg)  09/21/14 172 lb (78 kg)     GEN: Patient is in no acute distress HEENT: Normal NECK: No JVD; No carotid bruits LYMPHATICS: No lymphadenopathy CARDIAC: S1 S2 regular, 2/6 systolic murmur at the apex. RESPIRATORY:  Clear to auscultation without rales, wheezing or rhonchi  ABDOMEN: Soft, non-tender, non-distended MUSCULOSKELETAL:  No edema; No deformity  SKIN: Warm and  dry NEUROLOGIC:  Alert and oriented x 3 PSYCHIATRIC:  Normal affect    Signed, 11/21/14, MD  12/27/2020 1:01 PM    [facility]

## 2021-01-06 ENCOUNTER — Telehealth (HOSPITAL_COMMUNITY): Payer: Self-pay | Admitting: *Deleted

## 2021-01-06 ENCOUNTER — Telehealth (HOSPITAL_COMMUNITY): Payer: Self-pay | Admitting: Emergency Medicine

## 2021-01-06 NOTE — Telephone Encounter (Signed)
Attempted to call patient regarding upcoming cardiac CT appointment. °Left message on voicemail with name and callback number °Brooklinn Longbottom RN Navigator Cardiac Imaging °[facility] °[phone] Office °[phone] Cell ° °

## 2021-01-06 NOTE — Telephone Encounter (Signed)
Patient returning call regarding upcoming cardiac imaging study; pt verbalizes understanding of appt date/time, parking situation and where to check in, pre-test NPO status and medications ordered, and verified current allergies; name and call back number provided for further questions should they arise  Jamarian Jacinto RN Navigator Cardiac Imaging [facility] [phone] office [phone] cell  Patient to take 100mg metoprolol tartrate 2 hours prior to cardiac CT. 

## 2021-01-07 ENCOUNTER — Ambulatory Visit (HOSPITAL_COMMUNITY)
Admission: RE | Admit: 2021-01-07 | Discharge: 2021-01-07 | Disposition: A | Discharge status: Home / Self Care | Payer: 59 | Source: Ambulatory Visit | Attending: Cardiology | Admitting: Cardiology

## 2021-01-07 ENCOUNTER — Encounter (HOSPITAL_COMMUNITY): Payer: Self-pay

## 2021-01-07 ENCOUNTER — Other Ambulatory Visit: Payer: Self-pay

## 2021-01-07 DIAGNOSIS — I259 Chronic ischemic heart disease, unspecified: Secondary | ICD-10-CM | POA: Insufficient documentation

## 2021-01-07 MED ORDER — NITROGLYCERIN 0.4 MG SL SUBL
SUBLINGUAL_TABLET | SUBLINGUAL | Status: AC
  Filled 2021-01-07: qty 2

## 2021-01-07 MED ORDER — NITROGLYCERIN 0.4 MG SL SUBL
0.8000 mg | SUBLINGUAL_TABLET | Freq: Once | SUBLINGUAL | Status: AC
  Administered 2021-01-07: 0.8 mg via SUBLINGUAL

## 2021-01-07 MED ORDER — IOHEXOL 350 MG/ML SOLN
95.0000 mL | Freq: Once | INTRAVENOUS | Status: AC | PRN
  Administered 2021-01-07: 95 mL via INTRAVENOUS

## 2021-01-14 DIAGNOSIS — M5416 Radiculopathy, lumbar region: Secondary | ICD-10-CM | POA: Insufficient documentation

## 2021-01-14 HISTORY — DX: Radiculopathy, lumbar region: M54.16

## 2021-01-29 ENCOUNTER — Ambulatory Visit (INDEPENDENT_AMBULATORY_CARE_PROVIDER_SITE_OTHER): Payer: 59 | Admitting: Ophthalmology

## 2021-01-29 ENCOUNTER — Other Ambulatory Visit: Payer: Self-pay

## 2021-01-29 ENCOUNTER — Encounter (INDEPENDENT_AMBULATORY_CARE_PROVIDER_SITE_OTHER): Payer: Self-pay | Admitting: Ophthalmology

## 2021-01-29 DIAGNOSIS — B5801 Toxoplasma chorioretinitis: Secondary | ICD-10-CM

## 2021-01-29 DIAGNOSIS — H43391 Other vitreous opacities, right eye: Secondary | ICD-10-CM

## 2021-01-29 NOTE — Progress Notes (Signed)
01/29/2021     CHIEF COMPLAINT Patient presents for Retina Follow Up (3 month fu OD/Pt states "I still have problems with blurred vision OD. Bright lights, sunlight, computer screen sensitivity still. I am wanting to speak to Dr. Luciana Axe about the surgery he mentioned. Difficult to use computer now, even watching t.v. at night time it is a pain.")   HISTORY OF PRESENT ILLNESS: Shawn Hutchinson is a 45 y.o. male who presents to the clinic today for:   HPI     Retina Follow Up           Diagnosis: Other   Laterality: right eye   Onset: 3 months ago   Severity: mild   Duration: 3 months   Course: gradually improving   Comments: 3 month fu OD Pt states "I still have problems with blurred vision OD. Bright lights, sunlight, computer screen sensitivity still. I am wanting to speak to Dr. Luciana Axe about the surgery he mentioned. Difficult to use computer now, even watching t.v. at night time it is a pain."       Last edited by Edmon Crape, MD on 01/29/2021  9:50 AM.      Referring physician: [name], NP [address] [place],  [place] [address]  HISTORICAL INFORMATION:   Selected notes from the MEDICAL RECORD NUMBER       CURRENT MEDICATIONS: No current outpatient medications on file. (Ophthalmic Drugs)   No current facility-administered medications for this visit. (Ophthalmic Drugs)   Current Outpatient Medications (Other)  Medication Sig   aspirin EC 81 MG tablet Take 1 tablet (81 mg total) by mouth daily. Swallow whole.   cholecalciferol (VITAMIN D3) 25 MCG (1000 UNIT) tablet Take 1,000 Units by mouth daily.   clobetasol (TEMOVATE) 0.05 % external solution Apply 1 application topically as needed for itching.   folic acid (FOLVITE) 1 MG tablet Take 1 mg by mouth daily.   hydrocortisone 2.5 % ointment Apply 1 application topically as needed for itching.   hydrOXYzine (ATARAX/VISTARIL) 25 MG tablet Take 25-50 mg by mouth as needed for itching.    levothyroxine (SYNTHROID) 150 MCG tablet Take 150 mcg by mouth daily before breakfast.   metoprolol tartrate (LOPRESSOR) 100 MG tablet Take 1 tablet (100 mg total) by mouth once for 1 dose. Take 2 hours prior to your CT if your heart rate is greater than 55   Multiple Vitamins-Minerals (MULTIVITAMIN WITH MINERALS) tablet Take 1 tablet by mouth daily.   nitroGLYCERIN (NITROSTAT) 0.4 MG SL tablet Place 1 tablet (0.4 mg total) under the tongue every 5 (five) minutes as needed.   sertraline (ZOLOFT) 100 MG tablet Take 150 mg by mouth daily.   thiamine 100 MG tablet Take 100 mg by mouth daily.   traZODone (DESYREL) 100 MG tablet Take 100 mg by mouth at bedtime.   No current facility-administered medications for this visit. 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   Past Surgical History:  Procedure  Laterality Date   HERNIA REPAIR      FAMILY HISTORY Family History  Problem Relation Age of Onset   Pancreatic cancer Other     SOCIAL HISTORY Social History   Tobacco Use   Smoking status: Never   Smokeless tobacco: Former         OPHTHALMIC EXAM:  Base Eye Exam     Visual Acuity (ETDRS)       Right Left   Dist Reynolds 20/20 20/20         Tonometry (Tonopen, 9:29 AM)       Right Left   Pressure 10 7         Pupils       Pupils Dark Light Shape React APD   Right PERRL 7 6 Round Brisk None   Left PERRL 7 6 Round Brisk None         Visual Fields (Counting fingers)       Left Right    Full Full         Extraocular Movement       Right Left    Full Full         Neuro/Psych     Oriented x3: Yes   Mood/Affect: Normal         Dilation     Right eye: 1.0% Mydriacyl, 2.5% Phenylephrine @ 9:29 AM           Slit Lamp and Fundus Exam     External Exam       Right Left   External Normal Normal         Slit Lamp Exam       Right Left   Lids/Lashes Normal Normal   Conjunctiva/Sclera White and quiet White and quiet   Cornea Clear Clear   Anterior Chamber No cells in the anterior chamber Deep and quiet   Iris Round and reactive Round and reactive   Lens Trace Nuclear sclerosis Trace Nuclear sclerosis   Anterior Vitreous no cells Normal         Fundus Exam       Right Left   Posterior Vitreous ,, Central vitreous floaters, minor    Disc Normal    C/D Ratio 0.4    Macula Normal    Vessels Overlying chorioretinal lesion superonasal, normal vasculature, 4-5 disc area size flat chorioretinal scar with no active lesion no active cells no active debris no active margins superonasal to the nerve    Periphery 4 DA Size chorioretinal scar, not active, Superonasal to nerve              IMAGING AND PROCEDURES  Imaging and Procedures for 01/29/21           ASSESSMENT/PLAN:  Toxoplasmosis chorioretinitis of right  eye Condition resolved from early 2022, no active retinitis at this time.  Vitreous floaters of right eye Minor vitreous debris noted centrally.  ICD-10-CM   1. Toxoplasmosis chorioretinitis of right eye  B58.01     2. Vitreous floaters of right eye  H43.391       1.  OD, resolved chorioretinitis related to toxoplasmosis reactivation.  No active debris no active inflammation OD.  2.  He relates his concerns of computer vision with bright screen causing glare.  This is only onset since his recent infection.  He has no other concerns during other types of of work.  Thus this is likely asthenopia of some sort and needs general eye examination, follow-up with [facility]  3.  I reassured the patient he has no retinal concerns at this time  Ophthalmic Meds Ordered this visit:  No orders of the defined types were placed in this encounter.      Return in about 3 years (around 01/30/2024) for DILATE OU, COLOR FP.  There are no Patient Instructions on file for this visit.   Explained the diagnoses, plan, and follow up with the patient and they expressed understanding.  Patient expressed understanding of the importance of proper follow up care.   Alford Highland Deejay Koppelman M.D. Diseases & Surgery of the Retina and Vitreous [facility] 01/29/21     Abbreviations: M myopia (nearsighted); A astigmatism; H hyperopia (farsighted); P presbyopia; Mrx spectacle prescription;  CTL contact lenses; OD right eye; OS left eye; OU both eyes  XT exotropia; ET esotropia; PEK punctate epithelial keratitis; PEE punctate epithelial erosions; DES dry eye syndrome; MGD meibomian gland dysfunction; ATs artificial tears; PFAT's preservative free artificial tears; NSC nuclear sclerotic cataract; PSC posterior subcapsular cataract; ERM epi-retinal membrane; PVD posterior vitreous detachment; RD retinal detachment; DM diabetes mellitus; DR diabetic retinopathy; NPDR non-proliferative diabetic  retinopathy; PDR proliferative diabetic retinopathy; CSME clinically significant macular edema; DME diabetic macular edema; dbh dot blot hemorrhages; CWS cotton wool spot; POAG primary open angle glaucoma; C/D cup-to-disc ratio; HVF humphrey visual field; GVF goldmann visual field; OCT optical coherence tomography; IOP intraocular pressure; BRVO Branch retinal vein occlusion; CRVO central retinal vein occlusion; CRAO central retinal artery occlusion; BRAO branch retinal artery occlusion; RT retinal tear; SB scleral buckle; PPV pars plana vitrectomy; VH Vitreous hemorrhage; PRP panretinal laser photocoagulation; IVK intravitreal kenalog; VMT vitreomacular traction; MH Macular hole;  NVD neovascularization of the disc; NVE neovascularization elsewhere; AREDS age related eye disease study; ARMD age related macular degeneration; POAG primary open angle glaucoma; EBMD epithelial/anterior basement membrane dystrophy; ACIOL anterior chamber intraocular lens; IOL intraocular lens; PCIOL posterior chamber intraocular lens; Phaco/IOL phacoemulsification with intraocular lens placement; PRK photorefractive keratectomy; LASIK laser assisted in situ keratomileusis; HTN hypertension; DM diabetes mellitus; COPD chronic obstructive pulmonary disease

## 2021-01-29 NOTE — Assessment & Plan Note (Signed)
Condition resolved from early 2022, no active retinitis at this time.

## 2021-01-29 NOTE — Assessment & Plan Note (Signed)
Minor vitreous debris noted centrally.

## 2021-02-06 ENCOUNTER — Other Ambulatory Visit: Payer: Self-pay

## 2021-02-07 ENCOUNTER — Ambulatory Visit (INDEPENDENT_AMBULATORY_CARE_PROVIDER_SITE_OTHER): Payer: 59 | Admitting: Cardiology

## 2021-02-07 ENCOUNTER — Encounter: Payer: Self-pay | Admitting: Cardiology

## 2021-02-07 ENCOUNTER — Other Ambulatory Visit: Payer: Self-pay

## 2021-02-07 VITALS — BP 108/74 | HR 78 | Resp 18 | Ht 71.0 in | Wt 191.4 lb

## 2021-02-07 DIAGNOSIS — R079 Chest pain, unspecified: Secondary | ICD-10-CM

## 2021-02-07 DIAGNOSIS — E782 Mixed hyperlipidemia: Secondary | ICD-10-CM | POA: Diagnosis not present

## 2021-02-07 DIAGNOSIS — F1010 Alcohol abuse, uncomplicated: Secondary | ICD-10-CM | POA: Diagnosis not present

## 2021-02-07 HISTORY — DX: Chest pain, unspecified: R07.9

## 2021-02-07 NOTE — Patient Instructions (Signed)

## 2021-02-07 NOTE — Progress Notes (Signed)
Cardiology Office Note:    Date:  02/07/2021   ID:  Shawn Hutchinson, DOB 1975-08-10, MRN [id number]  PCP:  [name], NP  Cardiologist:  [name], MD   Referring MD: [name], NP    ASSESSMENT:    1. Mixed hyperlipidemia   2. Alcohol abuse   3. Chest pain of uncertain etiology    PLAN:    In order of problems listed above:  Primary prevention stressed with the patient.  Importance of compliance with diet medication stressed and vocalized understanding.  He was advised to walk half an hour a day and continue his excellent effort and exercise protocol. Mixed dyslipidemia: Lipids were reviewed and lifestyle modification and diet emphasized and he promises to do so. Chest pain: Noncardiac in nature given the excellent results of the CT calcium scoring.  Noncardiac chest pain needs to be evaluated. Alcohol abuse: I discussed this with the patient at length and he is happy to have quit since the past 3 months and I congratulated him. Patient will be seen in follow-up appointment in 6 months or earlier if the patient has any concerns.   Medication Adjustments/Labs and Tests Ordered: Current medicines are reviewed at length with the patient today.  Concerns regarding medicines are outlined above.  No orders of the defined types were placed in this encounter.  No orders of the defined types were placed in this encounter.    Chief Complaint  Patient presents with   Follow-up     History of Present Illness:    Shawn Hutchinson is a 45 y.o. male.  Patient has past medical history of chest pain, alcohol abuse and he mentions to me that he has quit drinking since the past 3 months.  He has history of mixed dyslipidemia.  He denies any problems at this time and takes care of activities of daily living.  No chest pain orthopnea or PND.  He is walking on a regular basis now about half an hour a day without symptoms.  At the time of my evaluation, the patient is alert awake oriented  and in no distress.  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    Past Surgical History:  Procedure Laterality Date   HERNIA REPAIR      Current Medications: Current Meds  Medication Sig   aspirin EC 81 MG tablet Take 1 tablet (81 mg total) by mouth daily. Swallow whole.   cholecalciferol (VITAMIN D3) 25 MCG (1000 UNIT) tablet Take 1,000 Units by mouth daily.  clobetasol (TEMOVATE) 0.05 % external solution Apply 1 application topically as needed for itching.   folic acid (FOLVITE) 1 MG tablet Take 1 mg by mouth daily.   hydrocortisone 2.5 % ointment Apply 1 application topically as needed for itching.   hydrOXYzine (ATARAX/VISTARIL) 25 MG tablet Take 25-50 mg by mouth as needed for itching.   levothyroxine (SYNTHROID) 150 MCG tablet Take 150 mcg by mouth daily before breakfast.   Multiple Vitamins-Minerals (MULTIVITAMIN WITH MINERALS) tablet Take 1 tablet by mouth daily.   nitroGLYCERIN  (NITROSTAT) 0.4 MG SL tablet Place 1 tablet (0.4 mg total) under the tongue every 5 (five) minutes as needed.   sertraline (ZOLOFT) 100 MG tablet Take 150 mg by mouth daily.   thiamine 100 MG tablet Take 100 mg by mouth daily.   traZODone (DESYREL) 100 MG tablet Take 100 mg by mouth at bedtime.   triamcinolone (KENALOG) 0.025 % cream Apply topically.     Allergies:   Patient has no known allergies.   Social History   Socioeconomic History   Marital status: Single    Spouse name: Not on file   Number of children: Not on file   Years of education: Not on file   Highest education level: Not on file  Occupational History   Not on file  Tobacco Use   Smoking status: Never   Smokeless tobacco: Former  Substance and Sexual Activity   Alcohol use: Not on file   Drug use: Not on file   Sexual activity: Not on file  Other Topics Concern   Not on file  Social History Narrative   Not on file   Social Determinants of Health   Financial Resource Strain: Not on file  Food Insecurity: Not on file  Transportation Needs: Not on file  Physical Activity: Not on file  Stress: Not on file  Social Connections: Not on file     Family History: The patient's family history includes Pancreatic cancer in an other family member.  ROS:   Please see the history of present illness.    All other systems reviewed and are negative.  EKGs/Labs/Other Studies Reviewed:    The following studies were reviewed today: IMPRESSION: 1. Coronary calcium score of 0. This was 0 percentile for age and sex matched control.   2.  Normal coronary origin with right dominance.   3.  No evidence of CAD.   4.  Consider non atherosclerotic causes of chest pain.   Armanda Magic     Electronically Signed   By: Armanda Magic   On: 01/09/2021 09:43   Recent Labs: No results found for requested labs within last 8760 hours.  Recent Lipid Panel No results found for: CHOL, TRIG, HDL, CHOLHDL, VLDL, LDLCALC,  LDLDIRECT  Physical Exam:    VS:  BP 108/74 (BP Location: Left Arm, Patient Position: Sitting, Cuff Size: Normal)   Pulse 78   Resp 18   Ht 5\' 11"  (1.803 m)   Wt 191 lb 6.4 oz (86.8 kg)   SpO2 97%   BMI 26.69 kg/m     Wt Readings from Last 3 Encounters:  02/07/21 191 lb 6.4 oz (86.8 kg)  12/27/20 187 lb 9.6 oz (85.1 kg)  09/21/14 172 lb (78 kg)     GEN: Patient is in no acute distress HEENT: Normal NECK: No JVD; No carotid bruits LYMPHATICS: No lymphadenopathy CARDIAC: Hear sounds regular, 2/6 systolic murmur at the apex. RESPIRATORY:  Clear to auscultation without rales, wheezing or rhonchi  ABDOMEN: Soft, non-tender, non-distended MUSCULOSKELETAL:  No edema; No deformity  SKIN: Warm and dry NEUROLOGIC:  Alert and oriented x 3 PSYCHIATRIC:  Normal affect   Signed, [name], MD  02/07/2021 3:07 PM    [facility]

## 2021-02-25 DIAGNOSIS — M4802 Spinal stenosis, cervical region: Secondary | ICD-10-CM

## 2021-02-25 HISTORY — DX: Spinal stenosis, cervical region: M48.02

## 2021-06-09 HISTORY — PX: HERNIA REPAIR: SHX51

## 2021-11-11 ENCOUNTER — Ambulatory Visit: Payer: Managed Care, Other (non HMO) | Admitting: Neurology

## 2021-11-14 ENCOUNTER — Telehealth: Payer: Self-pay | Admitting: Cardiology

## 2021-11-14 NOTE — Telephone Encounter (Signed)
Pt c/o of Chest Pain: STAT if CP now or developed within 24 hours  1. Are you having CP right now? No   2. Are you experiencing any other symptoms (ex. SOB, nausea, vomiting, sweating)? No   3. How long have you been experiencing CP? Few Months   4. Is your CP continuous or coming and going? Continuous, some sharp some heavy   5. Have you taken Nitroglycerin? No  ?    Patient states he experiences the CP mostly when drinking, and is unsure of what other symptoms he may be having due to the CP because of that. Is wanting to know if he should see Dr. Tomie China sooner than July due to the symptoms and abnormal EKG.

## 2021-11-14 NOTE — Telephone Encounter (Signed)
Called pt who's speech is slurred and asked if he has been drinking he relies I drink everyday. Unable to describe chest pain but had a negative cardiac CT last year. Advised to call PCP or go to the ED. Pt verbalized understanding and had no additional questions.

## 2021-11-24 ENCOUNTER — Encounter: Payer: Self-pay | Admitting: Cardiology

## 2021-11-24 ENCOUNTER — Encounter: Payer: Self-pay | Admitting: *Deleted

## 2021-12-03 DIAGNOSIS — R9431 Abnormal electrocardiogram [ECG] [EKG]: Secondary | ICD-10-CM | POA: Insufficient documentation

## 2021-12-22 ENCOUNTER — Ambulatory Visit: Payer: Self-pay | Admitting: Cardiology

## 2022-04-28 ENCOUNTER — Telehealth: Payer: Self-pay | Admitting: Genetic Counselor

## 2022-04-28 NOTE — Telephone Encounter (Signed)
Scheduled appointment per 11/07 referral. Patient is aware of appointment date and time. Patient is aware to arrive 15 mins prior to appointment time and to bring updated insurance cards. Patient is aware of location.   

## 2022-06-09 ENCOUNTER — Other Ambulatory Visit: Payer: Self-pay

## 2022-06-09 ENCOUNTER — Encounter: Payer: Self-pay | Admitting: Genetic Counselor

## 2022-07-20 IMAGING — CT CT HEART MORP W/ CTA COR W/ SCORE W/ CA W/CM &/OR W/O CM
4 of 7 series · 8 of 20 positions shown, 9 images · non-contrast
Comparison: None.
COMPARISON: None.

Addendum:
EXAM:
OVER-READ INTERPRETATION  CT CHEST

The following report is an over-read performed by radiologist Dr.
Nagicu Aisea [REDACTED] on 01/07/2021. This
over-read does not include interpretation of cardiac or coronary
anatomy or pathology. The coronary calcium score/coronary CTA
interpretation by the cardiologist is attached.
TECHNIQUE: The patient was scanned on a [employer] scanner.

[Series 6: best diast 68 % · axial · 0.39mm/px · z∈[+1218,+1262]mm · 2 of 331 slices shown]
[im 111/331  vessel]
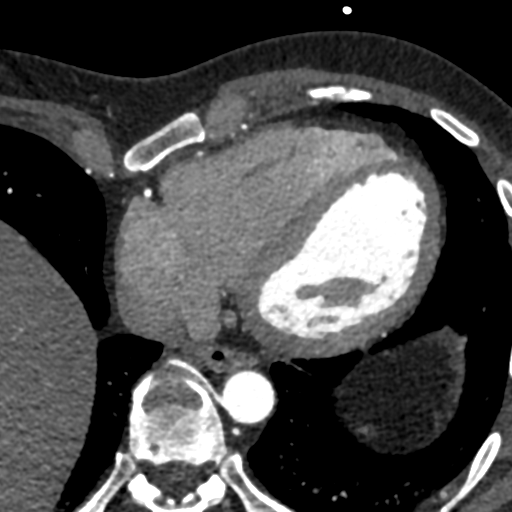
[im 221/331  vessel]
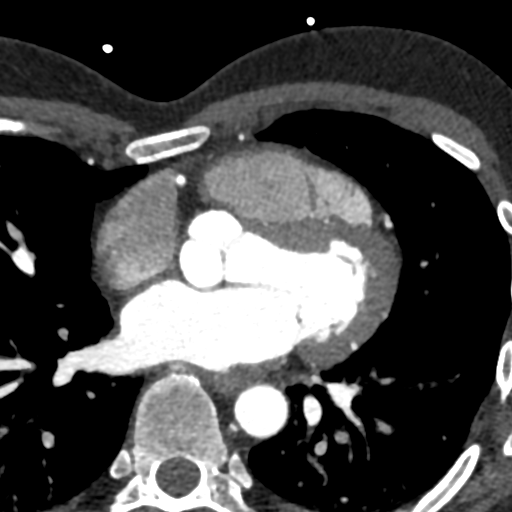

[Series 7: best syst · axial · 0.39mm/px · z∈[+1218,+1262]mm · 2 of 331 slices shown, 3 images]
[im 111/331  vessel]
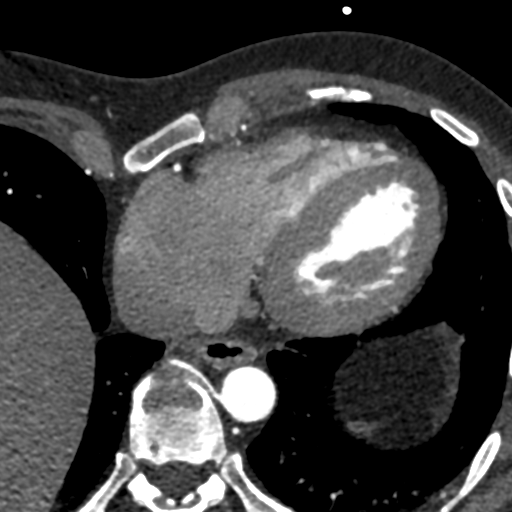
[im 111/331  lung]
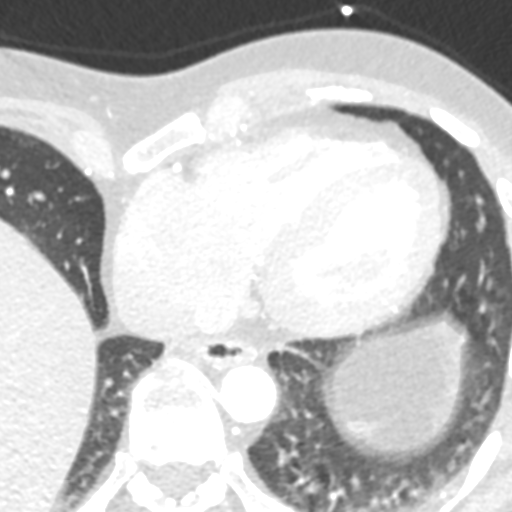
[im 221/331  vessel]
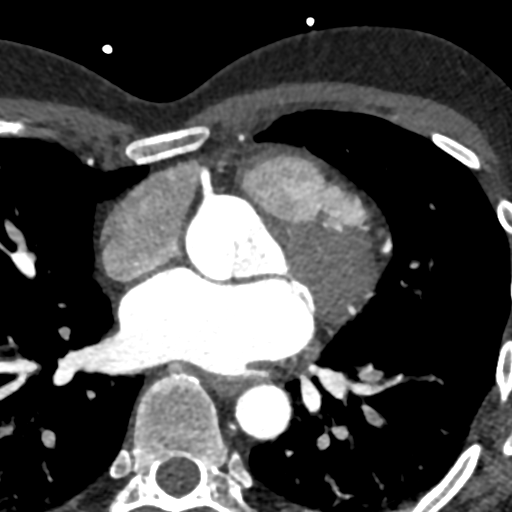

[Series 8: ts diast sharp 68 % · axial · 0.39mm/px · z∈[+1218,+1262]mm · 2 of 331 slices shown]
[im 111/331  lung]
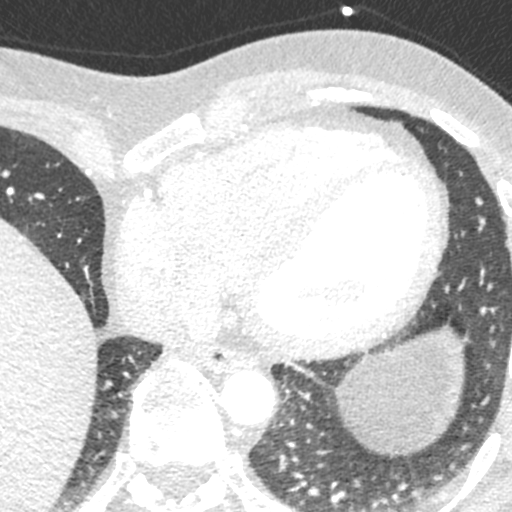
[im 221/331  lung]
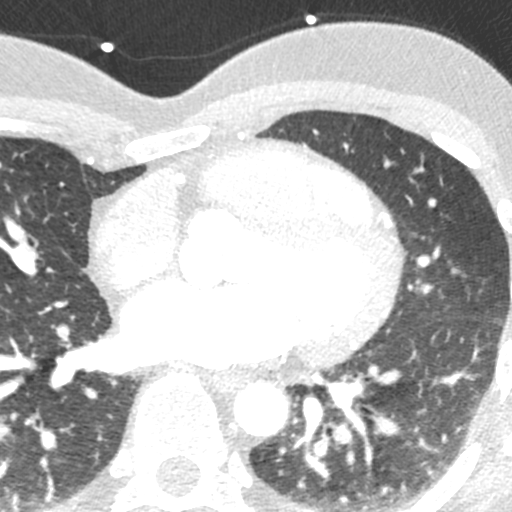

[Series 9: ts syst sharp · axial · 0.39mm/px · z∈[+1218,+1262]mm · 2 of 331 slices shown]
[im 111/331  lung]
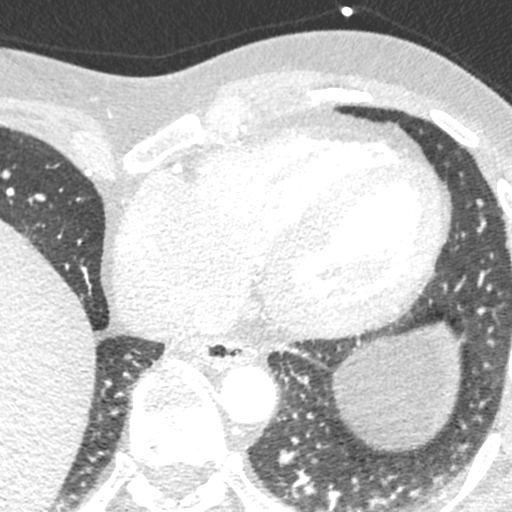
[im 221/331  lung]
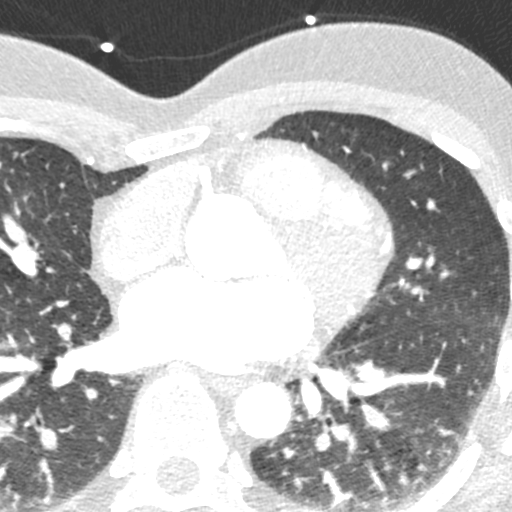

[8 of 20 positions shown; findings below may reference images not displayed]

FINDINGS: 4 mm pulmonary nodule in the right middle lobe (axial image 21 of
series 11) associated with the minor fissure. Within the visualized
portions of the thorax there are no other larger more suspicious
appearing pulmonary nodules or masses, there is no acute
consolidative airspace disease, no pleural effusions, no
pneumothorax and no lymphadenopathy. Visualized portions of the
upper abdomen are unremarkable. There are no aggressive appearing
lytic or blastic lesions noted in the visualized portions of the
skeleton.
IMPRESSION: 1. 4 mm right middle lobe pulmonary nodule, nonspecific, but
statistically likely a benign subpleural lymph node. No follow-up
needed if patient is low-risk. Non-contrast chest CT can be
considered in 12 months if patient is high-risk. This recommendation
follows the consensus statement: Guidelines for Management of
Incidental Pulmonary Nodules Detected on CT Images: From the

EXAM:
Cardiac/Coronary  CT
FINDINGS: A 120 kV prospective scan was triggered in the descending thoracic
aorta at 111 HU's. Axial non-contrast 3 mm slices were carried out
through the heart. The data set was analyzed on a dedicated work
station and scored using the Agatson method. Gantry rotation speed
was 250 msecs and collimation was .6 mm. No beta blockade and 0.8 mg
of sl NTG was given. The 3D data set was reconstructed in 5%
intervals of the 67-82 % of the R-R cycle. Diastolic phases were
analyzed on a dedicated work station using MPR, MIP and VRT modes.
The patient received 80 cc of contrast.

Aorta:  Normal size.  No calcifications.  No dissection.

Aortic Valve:  Trileaflet.  No calcifications.

Coronary Arteries:  Normal coronary origin.  Right dominance.

RCA is a large dominant artery that gives rise to PDA and PLVB.
There is no plaque.

Left main is a large artery that gives rise to LAD, Ramus and LCX
arteries. There is no plaque.

LAD is a large vessel that gives rise to moderate sized D1 and large
septal perforator. There is no plaque.

Ramus is a large vessel that bifurcates into 2 daughter branches.
There is no plaque.

LCX is a non-dominant artery that gives rise to one small OM1
branch. There is no plaque.

Other findings:

Normal pulmonary vein drainage into the left atrium.

Normal let atrial appendage without a thrombus.

Normal size of the pulmonary artery.
IMPRESSION: 1. Coronary calcium score of 0. This was 0 percentile for age and
sex matched control.

2.  Normal coronary origin with right dominance.

3.  No evidence of CAD.

4.  Consider non atherosclerotic causes of chest pain.

Neida Quang

*** End of Addendum ***
EXAM:
OVER-READ INTERPRETATION  CT CHEST

The following report is an over-read performed by radiologist Dr.
Nagicu Aisea [REDACTED] on 01/07/2021. This
over-read does not include interpretation of cardiac or coronary
anatomy or pathology. The coronary calcium score/coronary CTA
interpretation by the cardiologist is attached.
FINDINGS: 4 mm pulmonary nodule in the right middle lobe (axial image 21 of
series 11) associated with the minor fissure. Within the visualized
portions of the thorax there are no other larger more suspicious
appearing pulmonary nodules or masses, there is no acute
consolidative airspace disease, no pleural effusions, no
pneumothorax and no lymphadenopathy. Visualized portions of the
upper abdomen are unremarkable. There are no aggressive appearing
lytic or blastic lesions noted in the visualized portions of the
skeleton.
IMPRESSION: 1. 4 mm right middle lobe pulmonary nodule, nonspecific, but
statistically likely a benign subpleural lymph node. No follow-up
needed if patient is low-risk. Non-contrast chest CT can be
considered in 12 months if patient is high-risk. This recommendation
follows the consensus statement: Guidelines for Management of
Incidental Pulmonary Nodules Detected on CT Images: From the

## 2022-08-10 DIAGNOSIS — R0981 Nasal congestion: Secondary | ICD-10-CM | POA: Diagnosis not present

## 2022-08-10 DIAGNOSIS — R0602 Shortness of breath: Secondary | ICD-10-CM | POA: Diagnosis not present

## 2022-08-10 DIAGNOSIS — J209 Acute bronchitis, unspecified: Secondary | ICD-10-CM | POA: Diagnosis not present

## 2022-08-29 DIAGNOSIS — Z79899 Other long term (current) drug therapy: Secondary | ICD-10-CM | POA: Diagnosis not present

## 2022-08-29 DIAGNOSIS — F1990 Other psychoactive substance use, unspecified, uncomplicated: Secondary | ICD-10-CM | POA: Diagnosis not present

## 2022-08-29 DIAGNOSIS — E039 Hypothyroidism, unspecified: Secondary | ICD-10-CM | POA: Diagnosis not present

## 2022-08-29 DIAGNOSIS — I4510 Unspecified right bundle-branch block: Secondary | ICD-10-CM | POA: Diagnosis not present

## 2022-08-29 DIAGNOSIS — F1410 Cocaine abuse, uncomplicated: Secondary | ICD-10-CM | POA: Diagnosis not present

## 2022-08-29 DIAGNOSIS — R002 Palpitations: Secondary | ICD-10-CM | POA: Diagnosis not present

## 2022-08-29 DIAGNOSIS — R9431 Abnormal electrocardiogram [ECG] [EKG]: Secondary | ICD-10-CM | POA: Diagnosis not present

## 2022-08-29 DIAGNOSIS — F1490 Cocaine use, unspecified, uncomplicated: Secondary | ICD-10-CM | POA: Diagnosis not present

## 2022-08-29 DIAGNOSIS — G4700 Insomnia, unspecified: Secondary | ICD-10-CM | POA: Diagnosis not present

## 2022-08-29 DIAGNOSIS — R0602 Shortness of breath: Secondary | ICD-10-CM | POA: Diagnosis not present

## 2022-08-29 DIAGNOSIS — F1910 Other psychoactive substance abuse, uncomplicated: Secondary | ICD-10-CM | POA: Diagnosis not present

## 2022-08-29 DIAGNOSIS — R457 State of emotional shock and stress, unspecified: Secondary | ICD-10-CM | POA: Diagnosis not present

## 2022-10-21 DIAGNOSIS — R070 Pain in throat: Secondary | ICD-10-CM | POA: Diagnosis not present

## 2022-10-21 DIAGNOSIS — R519 Headache, unspecified: Secondary | ICD-10-CM | POA: Diagnosis not present

## 2022-10-21 DIAGNOSIS — B9789 Other viral agents as the cause of diseases classified elsewhere: Secondary | ICD-10-CM | POA: Diagnosis not present

## 2022-10-27 DIAGNOSIS — F411 Generalized anxiety disorder: Secondary | ICD-10-CM | POA: Diagnosis not present

## 2022-10-27 DIAGNOSIS — F332 Major depressive disorder, recurrent severe without psychotic features: Secondary | ICD-10-CM | POA: Diagnosis not present

## 2022-10-27 DIAGNOSIS — R000 Tachycardia, unspecified: Secondary | ICD-10-CM | POA: Diagnosis not present

## 2022-10-27 DIAGNOSIS — F329 Major depressive disorder, single episode, unspecified: Secondary | ICD-10-CM | POA: Diagnosis not present

## 2022-10-27 DIAGNOSIS — F10239 Alcohol dependence with withdrawal, unspecified: Secondary | ICD-10-CM | POA: Diagnosis not present

## 2022-10-27 DIAGNOSIS — I10 Essential (primary) hypertension: Secondary | ICD-10-CM | POA: Diagnosis not present

## 2022-10-27 DIAGNOSIS — R443 Hallucinations, unspecified: Secondary | ICD-10-CM | POA: Diagnosis not present

## 2022-10-27 DIAGNOSIS — F10232 Alcohol dependence with withdrawal with perceptual disturbance: Secondary | ICD-10-CM | POA: Diagnosis not present

## 2022-10-27 DIAGNOSIS — E039 Hypothyroidism, unspecified: Secondary | ICD-10-CM | POA: Diagnosis not present

## 2022-10-27 DIAGNOSIS — F1090 Alcohol use, unspecified, uncomplicated: Secondary | ICD-10-CM | POA: Diagnosis not present

## 2022-11-05 DIAGNOSIS — F1020 Alcohol dependence, uncomplicated: Secondary | ICD-10-CM | POA: Diagnosis not present

## 2022-11-05 DIAGNOSIS — E039 Hypothyroidism, unspecified: Secondary | ICD-10-CM | POA: Diagnosis not present

## 2022-11-05 DIAGNOSIS — Z1331 Encounter for screening for depression: Secondary | ICD-10-CM | POA: Diagnosis not present

## 2022-11-05 DIAGNOSIS — F339 Major depressive disorder, recurrent, unspecified: Secondary | ICD-10-CM | POA: Diagnosis not present

## 2022-11-13 DIAGNOSIS — F1020 Alcohol dependence, uncomplicated: Secondary | ICD-10-CM | POA: Diagnosis not present

## 2022-11-13 DIAGNOSIS — L309 Dermatitis, unspecified: Secondary | ICD-10-CM | POA: Diagnosis not present

## 2022-11-13 DIAGNOSIS — F410 Panic disorder [episodic paroxysmal anxiety] without agoraphobia: Secondary | ICD-10-CM | POA: Diagnosis not present

## 2022-11-13 DIAGNOSIS — B5800 Toxoplasma oculopathy, unspecified: Secondary | ICD-10-CM | POA: Diagnosis not present

## 2022-11-13 DIAGNOSIS — F4310 Post-traumatic stress disorder, unspecified: Secondary | ICD-10-CM | POA: Diagnosis not present

## 2022-11-13 DIAGNOSIS — E039 Hypothyroidism, unspecified: Secondary | ICD-10-CM | POA: Diagnosis not present

## 2022-11-13 DIAGNOSIS — F411 Generalized anxiety disorder: Secondary | ICD-10-CM | POA: Diagnosis not present

## 2022-11-13 DIAGNOSIS — F332 Major depressive disorder, recurrent severe without psychotic features: Secondary | ICD-10-CM | POA: Diagnosis not present

## 2022-12-09 DIAGNOSIS — F410 Panic disorder [episodic paroxysmal anxiety] without agoraphobia: Secondary | ICD-10-CM | POA: Diagnosis not present

## 2022-12-09 DIAGNOSIS — E039 Hypothyroidism, unspecified: Secondary | ICD-10-CM | POA: Diagnosis not present

## 2022-12-09 DIAGNOSIS — F332 Major depressive disorder, recurrent severe without psychotic features: Secondary | ICD-10-CM | POA: Diagnosis not present

## 2022-12-09 DIAGNOSIS — F4310 Post-traumatic stress disorder, unspecified: Secondary | ICD-10-CM | POA: Diagnosis not present

## 2022-12-09 DIAGNOSIS — L309 Dermatitis, unspecified: Secondary | ICD-10-CM | POA: Diagnosis not present

## 2022-12-09 DIAGNOSIS — F411 Generalized anxiety disorder: Secondary | ICD-10-CM | POA: Diagnosis not present

## 2022-12-09 DIAGNOSIS — B5800 Toxoplasma oculopathy, unspecified: Secondary | ICD-10-CM | POA: Diagnosis not present

## 2022-12-09 DIAGNOSIS — F1020 Alcohol dependence, uncomplicated: Secondary | ICD-10-CM | POA: Diagnosis not present

## 2022-12-10 DIAGNOSIS — F411 Generalized anxiety disorder: Secondary | ICD-10-CM | POA: Diagnosis not present

## 2022-12-10 DIAGNOSIS — B5800 Toxoplasma oculopathy, unspecified: Secondary | ICD-10-CM | POA: Diagnosis not present

## 2022-12-10 DIAGNOSIS — F1020 Alcohol dependence, uncomplicated: Secondary | ICD-10-CM | POA: Diagnosis not present

## 2022-12-10 DIAGNOSIS — F332 Major depressive disorder, recurrent severe without psychotic features: Secondary | ICD-10-CM | POA: Diagnosis not present

## 2022-12-10 DIAGNOSIS — E039 Hypothyroidism, unspecified: Secondary | ICD-10-CM | POA: Diagnosis not present

## 2022-12-10 DIAGNOSIS — F410 Panic disorder [episodic paroxysmal anxiety] without agoraphobia: Secondary | ICD-10-CM | POA: Diagnosis not present

## 2022-12-10 DIAGNOSIS — F4310 Post-traumatic stress disorder, unspecified: Secondary | ICD-10-CM | POA: Diagnosis not present

## 2022-12-10 DIAGNOSIS — L309 Dermatitis, unspecified: Secondary | ICD-10-CM | POA: Diagnosis not present

## 2022-12-11 DIAGNOSIS — B5800 Toxoplasma oculopathy, unspecified: Secondary | ICD-10-CM | POA: Diagnosis not present

## 2022-12-11 DIAGNOSIS — F4310 Post-traumatic stress disorder, unspecified: Secondary | ICD-10-CM | POA: Diagnosis not present

## 2022-12-11 DIAGNOSIS — F1020 Alcohol dependence, uncomplicated: Secondary | ICD-10-CM | POA: Diagnosis not present

## 2022-12-11 DIAGNOSIS — F332 Major depressive disorder, recurrent severe without psychotic features: Secondary | ICD-10-CM | POA: Diagnosis not present

## 2022-12-11 DIAGNOSIS — F411 Generalized anxiety disorder: Secondary | ICD-10-CM | POA: Diagnosis not present

## 2022-12-11 DIAGNOSIS — F410 Panic disorder [episodic paroxysmal anxiety] without agoraphobia: Secondary | ICD-10-CM | POA: Diagnosis not present

## 2022-12-11 DIAGNOSIS — E039 Hypothyroidism, unspecified: Secondary | ICD-10-CM | POA: Diagnosis not present

## 2022-12-11 DIAGNOSIS — L309 Dermatitis, unspecified: Secondary | ICD-10-CM | POA: Diagnosis not present

## 2022-12-12 DIAGNOSIS — F4310 Post-traumatic stress disorder, unspecified: Secondary | ICD-10-CM | POA: Diagnosis not present

## 2022-12-12 DIAGNOSIS — F1020 Alcohol dependence, uncomplicated: Secondary | ICD-10-CM | POA: Diagnosis not present

## 2022-12-12 DIAGNOSIS — F410 Panic disorder [episodic paroxysmal anxiety] without agoraphobia: Secondary | ICD-10-CM | POA: Diagnosis not present

## 2022-12-12 DIAGNOSIS — F332 Major depressive disorder, recurrent severe without psychotic features: Secondary | ICD-10-CM | POA: Diagnosis not present

## 2022-12-12 DIAGNOSIS — B5800 Toxoplasma oculopathy, unspecified: Secondary | ICD-10-CM | POA: Diagnosis not present

## 2022-12-12 DIAGNOSIS — E039 Hypothyroidism, unspecified: Secondary | ICD-10-CM | POA: Diagnosis not present

## 2022-12-12 DIAGNOSIS — L309 Dermatitis, unspecified: Secondary | ICD-10-CM | POA: Diagnosis not present

## 2022-12-12 DIAGNOSIS — F411 Generalized anxiety disorder: Secondary | ICD-10-CM | POA: Diagnosis not present

## 2022-12-14 DIAGNOSIS — F411 Generalized anxiety disorder: Secondary | ICD-10-CM | POA: Diagnosis not present

## 2022-12-14 DIAGNOSIS — F4310 Post-traumatic stress disorder, unspecified: Secondary | ICD-10-CM | POA: Diagnosis not present

## 2022-12-14 DIAGNOSIS — F1020 Alcohol dependence, uncomplicated: Secondary | ICD-10-CM | POA: Diagnosis not present

## 2022-12-14 DIAGNOSIS — E039 Hypothyroidism, unspecified: Secondary | ICD-10-CM | POA: Diagnosis not present

## 2022-12-14 DIAGNOSIS — F332 Major depressive disorder, recurrent severe without psychotic features: Secondary | ICD-10-CM | POA: Diagnosis not present

## 2022-12-14 DIAGNOSIS — L309 Dermatitis, unspecified: Secondary | ICD-10-CM | POA: Diagnosis not present

## 2022-12-14 DIAGNOSIS — B5800 Toxoplasma oculopathy, unspecified: Secondary | ICD-10-CM | POA: Diagnosis not present

## 2022-12-14 DIAGNOSIS — F410 Panic disorder [episodic paroxysmal anxiety] without agoraphobia: Secondary | ICD-10-CM | POA: Diagnosis not present

## 2022-12-15 DIAGNOSIS — F410 Panic disorder [episodic paroxysmal anxiety] without agoraphobia: Secondary | ICD-10-CM | POA: Diagnosis not present

## 2022-12-15 DIAGNOSIS — E039 Hypothyroidism, unspecified: Secondary | ICD-10-CM | POA: Diagnosis not present

## 2022-12-15 DIAGNOSIS — B5800 Toxoplasma oculopathy, unspecified: Secondary | ICD-10-CM | POA: Diagnosis not present

## 2022-12-15 DIAGNOSIS — F1020 Alcohol dependence, uncomplicated: Secondary | ICD-10-CM | POA: Diagnosis not present

## 2022-12-15 DIAGNOSIS — F332 Major depressive disorder, recurrent severe without psychotic features: Secondary | ICD-10-CM | POA: Diagnosis not present

## 2022-12-15 DIAGNOSIS — F411 Generalized anxiety disorder: Secondary | ICD-10-CM | POA: Diagnosis not present

## 2022-12-15 DIAGNOSIS — F4310 Post-traumatic stress disorder, unspecified: Secondary | ICD-10-CM | POA: Diagnosis not present

## 2022-12-15 DIAGNOSIS — L309 Dermatitis, unspecified: Secondary | ICD-10-CM | POA: Diagnosis not present

## 2022-12-17 DIAGNOSIS — B5800 Toxoplasma oculopathy, unspecified: Secondary | ICD-10-CM | POA: Diagnosis not present

## 2022-12-17 DIAGNOSIS — F4310 Post-traumatic stress disorder, unspecified: Secondary | ICD-10-CM | POA: Diagnosis not present

## 2022-12-17 DIAGNOSIS — L309 Dermatitis, unspecified: Secondary | ICD-10-CM | POA: Diagnosis not present

## 2022-12-17 DIAGNOSIS — F410 Panic disorder [episodic paroxysmal anxiety] without agoraphobia: Secondary | ICD-10-CM | POA: Diagnosis not present

## 2022-12-17 DIAGNOSIS — F332 Major depressive disorder, recurrent severe without psychotic features: Secondary | ICD-10-CM | POA: Diagnosis not present

## 2022-12-17 DIAGNOSIS — E039 Hypothyroidism, unspecified: Secondary | ICD-10-CM | POA: Diagnosis not present

## 2022-12-17 DIAGNOSIS — F411 Generalized anxiety disorder: Secondary | ICD-10-CM | POA: Diagnosis not present

## 2022-12-17 DIAGNOSIS — F1020 Alcohol dependence, uncomplicated: Secondary | ICD-10-CM | POA: Diagnosis not present

## 2022-12-18 DIAGNOSIS — B5801 Toxoplasma chorioretinitis: Secondary | ICD-10-CM | POA: Diagnosis not present

## 2022-12-18 DIAGNOSIS — H43819 Vitreous degeneration, unspecified eye: Secondary | ICD-10-CM | POA: Diagnosis not present

## 2022-12-21 DIAGNOSIS — E039 Hypothyroidism, unspecified: Secondary | ICD-10-CM | POA: Diagnosis not present

## 2022-12-21 DIAGNOSIS — F410 Panic disorder [episodic paroxysmal anxiety] without agoraphobia: Secondary | ICD-10-CM | POA: Diagnosis not present

## 2022-12-21 DIAGNOSIS — F332 Major depressive disorder, recurrent severe without psychotic features: Secondary | ICD-10-CM | POA: Diagnosis not present

## 2022-12-21 DIAGNOSIS — L309 Dermatitis, unspecified: Secondary | ICD-10-CM | POA: Diagnosis not present

## 2022-12-21 DIAGNOSIS — F4310 Post-traumatic stress disorder, unspecified: Secondary | ICD-10-CM | POA: Diagnosis not present

## 2022-12-21 DIAGNOSIS — F411 Generalized anxiety disorder: Secondary | ICD-10-CM | POA: Diagnosis not present

## 2022-12-21 DIAGNOSIS — B5800 Toxoplasma oculopathy, unspecified: Secondary | ICD-10-CM | POA: Diagnosis not present

## 2022-12-21 DIAGNOSIS — F1020 Alcohol dependence, uncomplicated: Secondary | ICD-10-CM | POA: Diagnosis not present

## 2022-12-22 DIAGNOSIS — F1020 Alcohol dependence, uncomplicated: Secondary | ICD-10-CM | POA: Diagnosis not present

## 2022-12-22 DIAGNOSIS — B5800 Toxoplasma oculopathy, unspecified: Secondary | ICD-10-CM | POA: Diagnosis not present

## 2022-12-22 DIAGNOSIS — F332 Major depressive disorder, recurrent severe without psychotic features: Secondary | ICD-10-CM | POA: Diagnosis not present

## 2022-12-22 DIAGNOSIS — E039 Hypothyroidism, unspecified: Secondary | ICD-10-CM | POA: Diagnosis not present

## 2022-12-22 DIAGNOSIS — F410 Panic disorder [episodic paroxysmal anxiety] without agoraphobia: Secondary | ICD-10-CM | POA: Diagnosis not present

## 2022-12-22 DIAGNOSIS — F4310 Post-traumatic stress disorder, unspecified: Secondary | ICD-10-CM | POA: Diagnosis not present

## 2022-12-22 DIAGNOSIS — L309 Dermatitis, unspecified: Secondary | ICD-10-CM | POA: Diagnosis not present

## 2022-12-22 DIAGNOSIS — F411 Generalized anxiety disorder: Secondary | ICD-10-CM | POA: Diagnosis not present

## 2022-12-24 DIAGNOSIS — E039 Hypothyroidism, unspecified: Secondary | ICD-10-CM | POA: Diagnosis not present

## 2022-12-24 DIAGNOSIS — F411 Generalized anxiety disorder: Secondary | ICD-10-CM | POA: Diagnosis not present

## 2022-12-24 DIAGNOSIS — F332 Major depressive disorder, recurrent severe without psychotic features: Secondary | ICD-10-CM | POA: Diagnosis not present

## 2022-12-24 DIAGNOSIS — F4310 Post-traumatic stress disorder, unspecified: Secondary | ICD-10-CM | POA: Diagnosis not present

## 2022-12-24 DIAGNOSIS — B5800 Toxoplasma oculopathy, unspecified: Secondary | ICD-10-CM | POA: Diagnosis not present

## 2022-12-24 DIAGNOSIS — F1020 Alcohol dependence, uncomplicated: Secondary | ICD-10-CM | POA: Diagnosis not present

## 2022-12-24 DIAGNOSIS — L309 Dermatitis, unspecified: Secondary | ICD-10-CM | POA: Diagnosis not present

## 2022-12-24 DIAGNOSIS — F410 Panic disorder [episodic paroxysmal anxiety] without agoraphobia: Secondary | ICD-10-CM | POA: Diagnosis not present

## 2022-12-28 DIAGNOSIS — L309 Dermatitis, unspecified: Secondary | ICD-10-CM | POA: Diagnosis not present

## 2022-12-28 DIAGNOSIS — F411 Generalized anxiety disorder: Secondary | ICD-10-CM | POA: Diagnosis not present

## 2022-12-28 DIAGNOSIS — B5800 Toxoplasma oculopathy, unspecified: Secondary | ICD-10-CM | POA: Diagnosis not present

## 2022-12-28 DIAGNOSIS — E039 Hypothyroidism, unspecified: Secondary | ICD-10-CM | POA: Diagnosis not present

## 2022-12-28 DIAGNOSIS — F1020 Alcohol dependence, uncomplicated: Secondary | ICD-10-CM | POA: Diagnosis not present

## 2022-12-28 DIAGNOSIS — F410 Panic disorder [episodic paroxysmal anxiety] without agoraphobia: Secondary | ICD-10-CM | POA: Diagnosis not present

## 2022-12-28 DIAGNOSIS — F4310 Post-traumatic stress disorder, unspecified: Secondary | ICD-10-CM | POA: Diagnosis not present

## 2022-12-28 DIAGNOSIS — F332 Major depressive disorder, recurrent severe without psychotic features: Secondary | ICD-10-CM | POA: Diagnosis not present

## 2022-12-29 DIAGNOSIS — F1020 Alcohol dependence, uncomplicated: Secondary | ICD-10-CM | POA: Diagnosis not present

## 2022-12-29 DIAGNOSIS — F411 Generalized anxiety disorder: Secondary | ICD-10-CM | POA: Diagnosis not present

## 2022-12-29 DIAGNOSIS — B5800 Toxoplasma oculopathy, unspecified: Secondary | ICD-10-CM | POA: Diagnosis not present

## 2022-12-29 DIAGNOSIS — L309 Dermatitis, unspecified: Secondary | ICD-10-CM | POA: Diagnosis not present

## 2022-12-29 DIAGNOSIS — F332 Major depressive disorder, recurrent severe without psychotic features: Secondary | ICD-10-CM | POA: Diagnosis not present

## 2022-12-29 DIAGNOSIS — E039 Hypothyroidism, unspecified: Secondary | ICD-10-CM | POA: Diagnosis not present

## 2022-12-29 DIAGNOSIS — F4310 Post-traumatic stress disorder, unspecified: Secondary | ICD-10-CM | POA: Diagnosis not present

## 2022-12-29 DIAGNOSIS — F410 Panic disorder [episodic paroxysmal anxiety] without agoraphobia: Secondary | ICD-10-CM | POA: Diagnosis not present

## 2022-12-31 DIAGNOSIS — F1020 Alcohol dependence, uncomplicated: Secondary | ICD-10-CM | POA: Diagnosis not present

## 2022-12-31 DIAGNOSIS — B5800 Toxoplasma oculopathy, unspecified: Secondary | ICD-10-CM | POA: Diagnosis not present

## 2022-12-31 DIAGNOSIS — F332 Major depressive disorder, recurrent severe without psychotic features: Secondary | ICD-10-CM | POA: Diagnosis not present

## 2022-12-31 DIAGNOSIS — F411 Generalized anxiety disorder: Secondary | ICD-10-CM | POA: Diagnosis not present

## 2022-12-31 DIAGNOSIS — F4310 Post-traumatic stress disorder, unspecified: Secondary | ICD-10-CM | POA: Diagnosis not present

## 2022-12-31 DIAGNOSIS — E039 Hypothyroidism, unspecified: Secondary | ICD-10-CM | POA: Diagnosis not present

## 2022-12-31 DIAGNOSIS — F410 Panic disorder [episodic paroxysmal anxiety] without agoraphobia: Secondary | ICD-10-CM | POA: Diagnosis not present

## 2022-12-31 DIAGNOSIS — L309 Dermatitis, unspecified: Secondary | ICD-10-CM | POA: Diagnosis not present

## 2023-01-04 DIAGNOSIS — F410 Panic disorder [episodic paroxysmal anxiety] without agoraphobia: Secondary | ICD-10-CM | POA: Diagnosis not present

## 2023-01-04 DIAGNOSIS — L309 Dermatitis, unspecified: Secondary | ICD-10-CM | POA: Diagnosis not present

## 2023-01-04 DIAGNOSIS — B5800 Toxoplasma oculopathy, unspecified: Secondary | ICD-10-CM | POA: Diagnosis not present

## 2023-01-04 DIAGNOSIS — F332 Major depressive disorder, recurrent severe without psychotic features: Secondary | ICD-10-CM | POA: Diagnosis not present

## 2023-01-04 DIAGNOSIS — F411 Generalized anxiety disorder: Secondary | ICD-10-CM | POA: Diagnosis not present

## 2023-01-04 DIAGNOSIS — E039 Hypothyroidism, unspecified: Secondary | ICD-10-CM | POA: Diagnosis not present

## 2023-01-04 DIAGNOSIS — F4310 Post-traumatic stress disorder, unspecified: Secondary | ICD-10-CM | POA: Diagnosis not present

## 2023-01-04 DIAGNOSIS — F1020 Alcohol dependence, uncomplicated: Secondary | ICD-10-CM | POA: Diagnosis not present

## 2023-01-05 DIAGNOSIS — F332 Major depressive disorder, recurrent severe without psychotic features: Secondary | ICD-10-CM | POA: Diagnosis not present

## 2023-01-05 DIAGNOSIS — F1020 Alcohol dependence, uncomplicated: Secondary | ICD-10-CM | POA: Diagnosis not present

## 2023-01-05 DIAGNOSIS — E039 Hypothyroidism, unspecified: Secondary | ICD-10-CM | POA: Diagnosis not present

## 2023-01-05 DIAGNOSIS — B5800 Toxoplasma oculopathy, unspecified: Secondary | ICD-10-CM | POA: Diagnosis not present

## 2023-01-05 DIAGNOSIS — F411 Generalized anxiety disorder: Secondary | ICD-10-CM | POA: Diagnosis not present

## 2023-01-05 DIAGNOSIS — F4310 Post-traumatic stress disorder, unspecified: Secondary | ICD-10-CM | POA: Diagnosis not present

## 2023-01-05 DIAGNOSIS — F410 Panic disorder [episodic paroxysmal anxiety] without agoraphobia: Secondary | ICD-10-CM | POA: Diagnosis not present

## 2023-01-05 DIAGNOSIS — L309 Dermatitis, unspecified: Secondary | ICD-10-CM | POA: Diagnosis not present

## 2023-01-11 DIAGNOSIS — F332 Major depressive disorder, recurrent severe without psychotic features: Secondary | ICD-10-CM | POA: Diagnosis not present

## 2023-01-11 DIAGNOSIS — F1020 Alcohol dependence, uncomplicated: Secondary | ICD-10-CM | POA: Diagnosis not present

## 2023-01-11 DIAGNOSIS — E039 Hypothyroidism, unspecified: Secondary | ICD-10-CM | POA: Diagnosis not present

## 2023-01-11 DIAGNOSIS — F4310 Post-traumatic stress disorder, unspecified: Secondary | ICD-10-CM | POA: Diagnosis not present

## 2023-01-11 DIAGNOSIS — B5800 Toxoplasma oculopathy, unspecified: Secondary | ICD-10-CM | POA: Diagnosis not present

## 2023-01-11 DIAGNOSIS — L309 Dermatitis, unspecified: Secondary | ICD-10-CM | POA: Diagnosis not present

## 2023-01-11 DIAGNOSIS — F411 Generalized anxiety disorder: Secondary | ICD-10-CM | POA: Diagnosis not present

## 2023-01-11 DIAGNOSIS — F410 Panic disorder [episodic paroxysmal anxiety] without agoraphobia: Secondary | ICD-10-CM | POA: Diagnosis not present

## 2023-01-12 DIAGNOSIS — F411 Generalized anxiety disorder: Secondary | ICD-10-CM | POA: Diagnosis not present

## 2023-01-12 DIAGNOSIS — E039 Hypothyroidism, unspecified: Secondary | ICD-10-CM | POA: Diagnosis not present

## 2023-01-12 DIAGNOSIS — L309 Dermatitis, unspecified: Secondary | ICD-10-CM | POA: Diagnosis not present

## 2023-01-12 DIAGNOSIS — F410 Panic disorder [episodic paroxysmal anxiety] without agoraphobia: Secondary | ICD-10-CM | POA: Diagnosis not present

## 2023-01-12 DIAGNOSIS — F332 Major depressive disorder, recurrent severe without psychotic features: Secondary | ICD-10-CM | POA: Diagnosis not present

## 2023-01-12 DIAGNOSIS — F4310 Post-traumatic stress disorder, unspecified: Secondary | ICD-10-CM | POA: Diagnosis not present

## 2023-01-12 DIAGNOSIS — B5800 Toxoplasma oculopathy, unspecified: Secondary | ICD-10-CM | POA: Diagnosis not present

## 2023-01-12 DIAGNOSIS — F1020 Alcohol dependence, uncomplicated: Secondary | ICD-10-CM | POA: Diagnosis not present

## 2023-01-14 DIAGNOSIS — B5800 Toxoplasma oculopathy, unspecified: Secondary | ICD-10-CM | POA: Diagnosis not present

## 2023-01-14 DIAGNOSIS — F4310 Post-traumatic stress disorder, unspecified: Secondary | ICD-10-CM | POA: Diagnosis not present

## 2023-01-14 DIAGNOSIS — E039 Hypothyroidism, unspecified: Secondary | ICD-10-CM | POA: Diagnosis not present

## 2023-01-14 DIAGNOSIS — F332 Major depressive disorder, recurrent severe without psychotic features: Secondary | ICD-10-CM | POA: Diagnosis not present

## 2023-01-14 DIAGNOSIS — F411 Generalized anxiety disorder: Secondary | ICD-10-CM | POA: Diagnosis not present

## 2023-01-14 DIAGNOSIS — L309 Dermatitis, unspecified: Secondary | ICD-10-CM | POA: Diagnosis not present

## 2023-01-14 DIAGNOSIS — F1020 Alcohol dependence, uncomplicated: Secondary | ICD-10-CM | POA: Diagnosis not present

## 2023-01-14 DIAGNOSIS — F410 Panic disorder [episodic paroxysmal anxiety] without agoraphobia: Secondary | ICD-10-CM | POA: Diagnosis not present

## 2023-01-18 DIAGNOSIS — F332 Major depressive disorder, recurrent severe without psychotic features: Secondary | ICD-10-CM | POA: Diagnosis not present

## 2023-01-18 DIAGNOSIS — F4310 Post-traumatic stress disorder, unspecified: Secondary | ICD-10-CM | POA: Diagnosis not present

## 2023-01-18 DIAGNOSIS — B5800 Toxoplasma oculopathy, unspecified: Secondary | ICD-10-CM | POA: Diagnosis not present

## 2023-01-18 DIAGNOSIS — L309 Dermatitis, unspecified: Secondary | ICD-10-CM | POA: Diagnosis not present

## 2023-01-18 DIAGNOSIS — F410 Panic disorder [episodic paroxysmal anxiety] without agoraphobia: Secondary | ICD-10-CM | POA: Diagnosis not present

## 2023-01-18 DIAGNOSIS — F411 Generalized anxiety disorder: Secondary | ICD-10-CM | POA: Diagnosis not present

## 2023-01-18 DIAGNOSIS — F1020 Alcohol dependence, uncomplicated: Secondary | ICD-10-CM | POA: Diagnosis not present

## 2023-01-18 DIAGNOSIS — E039 Hypothyroidism, unspecified: Secondary | ICD-10-CM | POA: Diagnosis not present

## 2023-01-19 DIAGNOSIS — F1991 Other psychoactive substance use, unspecified, in remission: Secondary | ICD-10-CM | POA: Diagnosis not present

## 2023-01-19 DIAGNOSIS — F4381 Prolonged grief disorder: Secondary | ICD-10-CM | POA: Diagnosis not present

## 2023-01-19 DIAGNOSIS — F1021 Alcohol dependence, in remission: Secondary | ICD-10-CM | POA: Diagnosis not present

## 2023-01-19 DIAGNOSIS — F4310 Post-traumatic stress disorder, unspecified: Secondary | ICD-10-CM | POA: Diagnosis not present

## 2023-01-21 DIAGNOSIS — E039 Hypothyroidism, unspecified: Secondary | ICD-10-CM | POA: Diagnosis not present

## 2023-01-21 DIAGNOSIS — F339 Major depressive disorder, recurrent, unspecified: Secondary | ICD-10-CM | POA: Diagnosis not present

## 2023-01-21 DIAGNOSIS — F332 Major depressive disorder, recurrent severe without psychotic features: Secondary | ICD-10-CM | POA: Diagnosis not present

## 2023-01-21 DIAGNOSIS — F4310 Post-traumatic stress disorder, unspecified: Secondary | ICD-10-CM | POA: Diagnosis not present

## 2023-01-21 DIAGNOSIS — F411 Generalized anxiety disorder: Secondary | ICD-10-CM | POA: Diagnosis not present

## 2023-01-21 DIAGNOSIS — B5800 Toxoplasma oculopathy, unspecified: Secondary | ICD-10-CM | POA: Diagnosis not present

## 2023-01-21 DIAGNOSIS — L309 Dermatitis, unspecified: Secondary | ICD-10-CM | POA: Diagnosis not present

## 2023-01-21 DIAGNOSIS — F1020 Alcohol dependence, uncomplicated: Secondary | ICD-10-CM | POA: Diagnosis not present

## 2023-01-21 DIAGNOSIS — F410 Panic disorder [episodic paroxysmal anxiety] without agoraphobia: Secondary | ICD-10-CM | POA: Diagnosis not present

## 2023-01-21 DIAGNOSIS — K921 Melena: Secondary | ICD-10-CM | POA: Diagnosis not present

## 2023-01-25 DIAGNOSIS — F1020 Alcohol dependence, uncomplicated: Secondary | ICD-10-CM | POA: Diagnosis not present

## 2023-01-25 DIAGNOSIS — B5800 Toxoplasma oculopathy, unspecified: Secondary | ICD-10-CM | POA: Diagnosis not present

## 2023-01-25 DIAGNOSIS — L309 Dermatitis, unspecified: Secondary | ICD-10-CM | POA: Diagnosis not present

## 2023-01-25 DIAGNOSIS — F332 Major depressive disorder, recurrent severe without psychotic features: Secondary | ICD-10-CM | POA: Diagnosis not present

## 2023-01-25 DIAGNOSIS — E039 Hypothyroidism, unspecified: Secondary | ICD-10-CM | POA: Diagnosis not present

## 2023-01-25 DIAGNOSIS — F410 Panic disorder [episodic paroxysmal anxiety] without agoraphobia: Secondary | ICD-10-CM | POA: Diagnosis not present

## 2023-01-25 DIAGNOSIS — F4310 Post-traumatic stress disorder, unspecified: Secondary | ICD-10-CM | POA: Diagnosis not present

## 2023-01-25 DIAGNOSIS — F411 Generalized anxiety disorder: Secondary | ICD-10-CM | POA: Diagnosis not present

## 2023-01-26 DIAGNOSIS — F411 Generalized anxiety disorder: Secondary | ICD-10-CM | POA: Diagnosis not present

## 2023-01-26 DIAGNOSIS — F332 Major depressive disorder, recurrent severe without psychotic features: Secondary | ICD-10-CM | POA: Diagnosis not present

## 2023-01-26 DIAGNOSIS — F1020 Alcohol dependence, uncomplicated: Secondary | ICD-10-CM | POA: Diagnosis not present

## 2023-01-26 DIAGNOSIS — R1012 Left upper quadrant pain: Secondary | ICD-10-CM | POA: Diagnosis not present

## 2023-01-26 DIAGNOSIS — L309 Dermatitis, unspecified: Secondary | ICD-10-CM | POA: Diagnosis not present

## 2023-01-26 DIAGNOSIS — F4310 Post-traumatic stress disorder, unspecified: Secondary | ICD-10-CM | POA: Diagnosis not present

## 2023-01-26 DIAGNOSIS — R1010 Upper abdominal pain, unspecified: Secondary | ICD-10-CM | POA: Diagnosis not present

## 2023-01-26 DIAGNOSIS — F410 Panic disorder [episodic paroxysmal anxiety] without agoraphobia: Secondary | ICD-10-CM | POA: Diagnosis not present

## 2023-01-26 DIAGNOSIS — E039 Hypothyroidism, unspecified: Secondary | ICD-10-CM | POA: Diagnosis not present

## 2023-01-26 DIAGNOSIS — B5800 Toxoplasma oculopathy, unspecified: Secondary | ICD-10-CM | POA: Diagnosis not present

## 2023-01-28 DIAGNOSIS — F4381 Prolonged grief disorder: Secondary | ICD-10-CM | POA: Diagnosis not present

## 2023-01-28 DIAGNOSIS — F1021 Alcohol dependence, in remission: Secondary | ICD-10-CM | POA: Diagnosis not present

## 2023-01-28 DIAGNOSIS — B5800 Toxoplasma oculopathy, unspecified: Secondary | ICD-10-CM | POA: Diagnosis not present

## 2023-01-28 DIAGNOSIS — L309 Dermatitis, unspecified: Secondary | ICD-10-CM | POA: Diagnosis not present

## 2023-01-28 DIAGNOSIS — F410 Panic disorder [episodic paroxysmal anxiety] without agoraphobia: Secondary | ICD-10-CM | POA: Diagnosis not present

## 2023-01-28 DIAGNOSIS — F1020 Alcohol dependence, uncomplicated: Secondary | ICD-10-CM | POA: Diagnosis not present

## 2023-01-28 DIAGNOSIS — F1991 Other psychoactive substance use, unspecified, in remission: Secondary | ICD-10-CM | POA: Diagnosis not present

## 2023-01-28 DIAGNOSIS — F332 Major depressive disorder, recurrent severe without psychotic features: Secondary | ICD-10-CM | POA: Diagnosis not present

## 2023-01-28 DIAGNOSIS — F411 Generalized anxiety disorder: Secondary | ICD-10-CM | POA: Diagnosis not present

## 2023-01-28 DIAGNOSIS — E039 Hypothyroidism, unspecified: Secondary | ICD-10-CM | POA: Diagnosis not present

## 2023-01-28 DIAGNOSIS — F4310 Post-traumatic stress disorder, unspecified: Secondary | ICD-10-CM | POA: Diagnosis not present

## 2023-02-02 DIAGNOSIS — F1020 Alcohol dependence, uncomplicated: Secondary | ICD-10-CM | POA: Diagnosis not present

## 2023-02-02 DIAGNOSIS — B5800 Toxoplasma oculopathy, unspecified: Secondary | ICD-10-CM | POA: Diagnosis not present

## 2023-02-02 DIAGNOSIS — E039 Hypothyroidism, unspecified: Secondary | ICD-10-CM | POA: Diagnosis not present

## 2023-02-02 DIAGNOSIS — F410 Panic disorder [episodic paroxysmal anxiety] without agoraphobia: Secondary | ICD-10-CM | POA: Diagnosis not present

## 2023-02-02 DIAGNOSIS — F332 Major depressive disorder, recurrent severe without psychotic features: Secondary | ICD-10-CM | POA: Diagnosis not present

## 2023-02-02 DIAGNOSIS — F4310 Post-traumatic stress disorder, unspecified: Secondary | ICD-10-CM | POA: Diagnosis not present

## 2023-02-02 DIAGNOSIS — F411 Generalized anxiety disorder: Secondary | ICD-10-CM | POA: Diagnosis not present

## 2023-02-02 DIAGNOSIS — L309 Dermatitis, unspecified: Secondary | ICD-10-CM | POA: Diagnosis not present

## 2023-02-04 DIAGNOSIS — F1020 Alcohol dependence, uncomplicated: Secondary | ICD-10-CM | POA: Diagnosis not present

## 2023-02-04 DIAGNOSIS — F411 Generalized anxiety disorder: Secondary | ICD-10-CM | POA: Diagnosis not present

## 2023-02-04 DIAGNOSIS — F332 Major depressive disorder, recurrent severe without psychotic features: Secondary | ICD-10-CM | POA: Diagnosis not present

## 2023-02-04 DIAGNOSIS — F4310 Post-traumatic stress disorder, unspecified: Secondary | ICD-10-CM | POA: Diagnosis not present

## 2023-02-04 DIAGNOSIS — L309 Dermatitis, unspecified: Secondary | ICD-10-CM | POA: Diagnosis not present

## 2023-02-04 DIAGNOSIS — F4381 Prolonged grief disorder: Secondary | ICD-10-CM | POA: Diagnosis not present

## 2023-02-04 DIAGNOSIS — B5800 Toxoplasma oculopathy, unspecified: Secondary | ICD-10-CM | POA: Diagnosis not present

## 2023-02-04 DIAGNOSIS — F1021 Alcohol dependence, in remission: Secondary | ICD-10-CM | POA: Diagnosis not present

## 2023-02-04 DIAGNOSIS — F1991 Other psychoactive substance use, unspecified, in remission: Secondary | ICD-10-CM | POA: Diagnosis not present

## 2023-02-04 DIAGNOSIS — E039 Hypothyroidism, unspecified: Secondary | ICD-10-CM | POA: Diagnosis not present

## 2023-02-04 DIAGNOSIS — F410 Panic disorder [episodic paroxysmal anxiety] without agoraphobia: Secondary | ICD-10-CM | POA: Diagnosis not present

## 2023-02-10 DIAGNOSIS — F1991 Other psychoactive substance use, unspecified, in remission: Secondary | ICD-10-CM | POA: Diagnosis not present

## 2023-02-10 DIAGNOSIS — F1021 Alcohol dependence, in remission: Secondary | ICD-10-CM | POA: Diagnosis not present

## 2023-02-10 DIAGNOSIS — F4310 Post-traumatic stress disorder, unspecified: Secondary | ICD-10-CM | POA: Diagnosis not present

## 2023-02-10 DIAGNOSIS — F4381 Prolonged grief disorder: Secondary | ICD-10-CM | POA: Diagnosis not present

## 2023-02-11 ENCOUNTER — Telehealth: Payer: Self-pay

## 2023-02-11 NOTE — Telephone Encounter (Signed)
Fong,  During chart prep for the patient's PV appt- looking over medications- does this patient need to hold (ANTABUSE) Disulfiram the morning of the procedure? Please advise Thank you Bre

## 2023-02-11 NOTE — Telephone Encounter (Signed)
This information will be noted on the patient's PV chart;

## 2023-02-15 DIAGNOSIS — F339 Major depressive disorder, recurrent, unspecified: Secondary | ICD-10-CM | POA: Diagnosis not present

## 2023-02-15 DIAGNOSIS — F1021 Alcohol dependence, in remission: Secondary | ICD-10-CM | POA: Diagnosis not present

## 2023-02-15 DIAGNOSIS — Z6827 Body mass index (BMI) 27.0-27.9, adult: Secondary | ICD-10-CM | POA: Diagnosis not present

## 2023-02-15 DIAGNOSIS — F4310 Post-traumatic stress disorder, unspecified: Secondary | ICD-10-CM | POA: Diagnosis not present

## 2023-02-15 DIAGNOSIS — F4381 Prolonged grief disorder: Secondary | ICD-10-CM | POA: Diagnosis not present

## 2023-02-15 DIAGNOSIS — F1991 Other psychoactive substance use, unspecified, in remission: Secondary | ICD-10-CM | POA: Diagnosis not present

## 2023-02-15 DIAGNOSIS — F1020 Alcohol dependence, uncomplicated: Secondary | ICD-10-CM | POA: Diagnosis not present

## 2023-02-15 DIAGNOSIS — E039 Hypothyroidism, unspecified: Secondary | ICD-10-CM | POA: Diagnosis not present

## 2023-02-22 DIAGNOSIS — F1021 Alcohol dependence, in remission: Secondary | ICD-10-CM | POA: Diagnosis not present

## 2023-02-22 DIAGNOSIS — F4381 Prolonged grief disorder: Secondary | ICD-10-CM | POA: Diagnosis not present

## 2023-02-22 DIAGNOSIS — F1991 Other psychoactive substance use, unspecified, in remission: Secondary | ICD-10-CM | POA: Diagnosis not present

## 2023-02-22 DIAGNOSIS — F4310 Post-traumatic stress disorder, unspecified: Secondary | ICD-10-CM | POA: Diagnosis not present

## 2023-03-01 ENCOUNTER — Encounter: Payer: Self-pay | Admitting: Gastroenterology

## 2023-03-01 DIAGNOSIS — F1991 Other psychoactive substance use, unspecified, in remission: Secondary | ICD-10-CM | POA: Diagnosis not present

## 2023-03-01 DIAGNOSIS — F4381 Prolonged grief disorder: Secondary | ICD-10-CM | POA: Diagnosis not present

## 2023-03-01 DIAGNOSIS — F4310 Post-traumatic stress disorder, unspecified: Secondary | ICD-10-CM | POA: Diagnosis not present

## 2023-03-01 DIAGNOSIS — F1021 Alcohol dependence, in remission: Secondary | ICD-10-CM | POA: Diagnosis not present

## 2023-03-08 DIAGNOSIS — F1991 Other psychoactive substance use, unspecified, in remission: Secondary | ICD-10-CM | POA: Diagnosis not present

## 2023-03-08 DIAGNOSIS — F1011 Alcohol abuse, in remission: Secondary | ICD-10-CM | POA: Diagnosis not present

## 2023-03-08 DIAGNOSIS — F4381 Prolonged grief disorder: Secondary | ICD-10-CM | POA: Diagnosis not present

## 2023-03-08 DIAGNOSIS — F1021 Alcohol dependence, in remission: Secondary | ICD-10-CM | POA: Diagnosis not present

## 2023-03-08 DIAGNOSIS — R1010 Upper abdominal pain, unspecified: Secondary | ICD-10-CM | POA: Diagnosis not present

## 2023-03-08 DIAGNOSIS — K921 Melena: Secondary | ICD-10-CM | POA: Diagnosis not present

## 2023-03-08 DIAGNOSIS — Z1211 Encounter for screening for malignant neoplasm of colon: Secondary | ICD-10-CM | POA: Diagnosis not present

## 2023-03-08 DIAGNOSIS — F4310 Post-traumatic stress disorder, unspecified: Secondary | ICD-10-CM | POA: Diagnosis not present

## 2023-03-10 ENCOUNTER — Ambulatory Visit: Payer: BC Managed Care – PPO | Admitting: Physician Assistant

## 2023-03-17 DIAGNOSIS — Z23 Encounter for immunization: Secondary | ICD-10-CM | POA: Diagnosis not present

## 2023-03-17 DIAGNOSIS — F1991 Other psychoactive substance use, unspecified, in remission: Secondary | ICD-10-CM | POA: Diagnosis not present

## 2023-03-17 DIAGNOSIS — F1021 Alcohol dependence, in remission: Secondary | ICD-10-CM | POA: Diagnosis not present

## 2023-03-17 DIAGNOSIS — F4310 Post-traumatic stress disorder, unspecified: Secondary | ICD-10-CM | POA: Diagnosis not present

## 2023-03-17 DIAGNOSIS — F4381 Prolonged grief disorder: Secondary | ICD-10-CM | POA: Diagnosis not present

## 2023-03-24 DIAGNOSIS — F1021 Alcohol dependence, in remission: Secondary | ICD-10-CM | POA: Diagnosis not present

## 2023-03-24 DIAGNOSIS — F4381 Prolonged grief disorder: Secondary | ICD-10-CM | POA: Diagnosis not present

## 2023-03-24 DIAGNOSIS — F4310 Post-traumatic stress disorder, unspecified: Secondary | ICD-10-CM | POA: Diagnosis not present

## 2023-03-24 DIAGNOSIS — F1991 Other psychoactive substance use, unspecified, in remission: Secondary | ICD-10-CM | POA: Diagnosis not present

## 2023-03-31 DIAGNOSIS — F4310 Post-traumatic stress disorder, unspecified: Secondary | ICD-10-CM | POA: Diagnosis not present

## 2023-03-31 DIAGNOSIS — F1991 Other psychoactive substance use, unspecified, in remission: Secondary | ICD-10-CM | POA: Diagnosis not present

## 2023-03-31 DIAGNOSIS — F4381 Prolonged grief disorder: Secondary | ICD-10-CM | POA: Diagnosis not present

## 2023-03-31 DIAGNOSIS — F1021 Alcohol dependence, in remission: Secondary | ICD-10-CM | POA: Diagnosis not present

## 2023-04-05 DIAGNOSIS — F1991 Other psychoactive substance use, unspecified, in remission: Secondary | ICD-10-CM | POA: Diagnosis not present

## 2023-04-05 DIAGNOSIS — F1021 Alcohol dependence, in remission: Secondary | ICD-10-CM | POA: Diagnosis not present

## 2023-04-05 DIAGNOSIS — F4381 Prolonged grief disorder: Secondary | ICD-10-CM | POA: Diagnosis not present

## 2023-04-05 DIAGNOSIS — F4310 Post-traumatic stress disorder, unspecified: Secondary | ICD-10-CM | POA: Diagnosis not present

## 2023-04-07 DIAGNOSIS — Z1211 Encounter for screening for malignant neoplasm of colon: Secondary | ICD-10-CM | POA: Diagnosis not present

## 2023-04-07 DIAGNOSIS — R1010 Upper abdominal pain, unspecified: Secondary | ICD-10-CM | POA: Diagnosis not present

## 2023-04-07 DIAGNOSIS — K635 Polyp of colon: Secondary | ICD-10-CM | POA: Diagnosis not present

## 2023-04-07 DIAGNOSIS — K921 Melena: Secondary | ICD-10-CM | POA: Diagnosis not present

## 2023-04-14 DIAGNOSIS — F1021 Alcohol dependence, in remission: Secondary | ICD-10-CM | POA: Diagnosis not present

## 2023-04-14 DIAGNOSIS — F1991 Other psychoactive substance use, unspecified, in remission: Secondary | ICD-10-CM | POA: Diagnosis not present

## 2023-04-14 DIAGNOSIS — F4310 Post-traumatic stress disorder, unspecified: Secondary | ICD-10-CM | POA: Diagnosis not present

## 2023-04-14 DIAGNOSIS — F4381 Prolonged grief disorder: Secondary | ICD-10-CM | POA: Diagnosis not present

## 2023-04-20 DIAGNOSIS — K200 Eosinophilic esophagitis: Secondary | ICD-10-CM | POA: Diagnosis not present

## 2023-04-21 DIAGNOSIS — K635 Polyp of colon: Secondary | ICD-10-CM | POA: Diagnosis not present

## 2023-04-22 DIAGNOSIS — Z131 Encounter for screening for diabetes mellitus: Secondary | ICD-10-CM | POA: Diagnosis not present

## 2023-04-22 DIAGNOSIS — Z0000 Encounter for general adult medical examination without abnormal findings: Secondary | ICD-10-CM | POA: Diagnosis not present

## 2023-04-23 DIAGNOSIS — Z6824 Body mass index (BMI) 24.0-24.9, adult: Secondary | ICD-10-CM | POA: Diagnosis not present

## 2023-04-23 DIAGNOSIS — B5800 Toxoplasma oculopathy, unspecified: Secondary | ICD-10-CM | POA: Diagnosis not present

## 2023-04-23 DIAGNOSIS — Z0000 Encounter for general adult medical examination without abnormal findings: Secondary | ICD-10-CM | POA: Diagnosis not present

## 2023-04-28 ENCOUNTER — Ambulatory Visit: Payer: BC Managed Care – PPO | Admitting: Physician Assistant

## 2023-04-28 DIAGNOSIS — F4310 Post-traumatic stress disorder, unspecified: Secondary | ICD-10-CM | POA: Diagnosis not present

## 2023-04-28 DIAGNOSIS — F4381 Prolonged grief disorder: Secondary | ICD-10-CM | POA: Diagnosis not present

## 2023-04-28 DIAGNOSIS — F1021 Alcohol dependence, in remission: Secondary | ICD-10-CM | POA: Diagnosis not present

## 2023-04-28 DIAGNOSIS — F1991 Other psychoactive substance use, unspecified, in remission: Secondary | ICD-10-CM | POA: Diagnosis not present

## 2023-05-03 DIAGNOSIS — Z860100 Personal history of colon polyps, unspecified: Secondary | ICD-10-CM | POA: Diagnosis not present

## 2023-05-03 DIAGNOSIS — K648 Other hemorrhoids: Secondary | ICD-10-CM | POA: Diagnosis not present

## 2023-05-03 DIAGNOSIS — K449 Diaphragmatic hernia without obstruction or gangrene: Secondary | ICD-10-CM | POA: Diagnosis not present

## 2023-05-03 DIAGNOSIS — K5904 Chronic idiopathic constipation: Secondary | ICD-10-CM | POA: Diagnosis not present

## 2023-05-04 DIAGNOSIS — L821 Other seborrheic keratosis: Secondary | ICD-10-CM | POA: Diagnosis not present

## 2023-05-04 DIAGNOSIS — L218 Other seborrheic dermatitis: Secondary | ICD-10-CM | POA: Diagnosis not present

## 2023-05-04 DIAGNOSIS — D225 Melanocytic nevi of trunk: Secondary | ICD-10-CM | POA: Diagnosis not present

## 2023-05-04 DIAGNOSIS — L814 Other melanin hyperpigmentation: Secondary | ICD-10-CM | POA: Diagnosis not present

## 2023-05-06 DIAGNOSIS — F1991 Other psychoactive substance use, unspecified, in remission: Secondary | ICD-10-CM | POA: Diagnosis not present

## 2023-05-06 DIAGNOSIS — F4310 Post-traumatic stress disorder, unspecified: Secondary | ICD-10-CM | POA: Diagnosis not present

## 2023-05-06 DIAGNOSIS — F1021 Alcohol dependence, in remission: Secondary | ICD-10-CM | POA: Diagnosis not present

## 2023-05-06 DIAGNOSIS — F4381 Prolonged grief disorder: Secondary | ICD-10-CM | POA: Diagnosis not present

## 2023-05-10 DIAGNOSIS — F411 Generalized anxiety disorder: Secondary | ICD-10-CM | POA: Diagnosis not present

## 2023-05-10 DIAGNOSIS — J029 Acute pharyngitis, unspecified: Secondary | ICD-10-CM | POA: Diagnosis not present

## 2023-05-12 DIAGNOSIS — F1021 Alcohol dependence, in remission: Secondary | ICD-10-CM | POA: Diagnosis not present

## 2023-05-12 DIAGNOSIS — F4310 Post-traumatic stress disorder, unspecified: Secondary | ICD-10-CM | POA: Diagnosis not present

## 2023-05-12 DIAGNOSIS — F1991 Other psychoactive substance use, unspecified, in remission: Secondary | ICD-10-CM | POA: Diagnosis not present

## 2023-05-12 DIAGNOSIS — F4381 Prolonged grief disorder: Secondary | ICD-10-CM | POA: Diagnosis not present

## 2023-05-19 DIAGNOSIS — F1021 Alcohol dependence, in remission: Secondary | ICD-10-CM | POA: Diagnosis not present

## 2023-05-19 DIAGNOSIS — F4310 Post-traumatic stress disorder, unspecified: Secondary | ICD-10-CM | POA: Diagnosis not present

## 2023-05-19 DIAGNOSIS — F4381 Prolonged grief disorder: Secondary | ICD-10-CM | POA: Diagnosis not present

## 2023-05-19 DIAGNOSIS — F1991 Other psychoactive substance use, unspecified, in remission: Secondary | ICD-10-CM | POA: Diagnosis not present

## 2023-05-26 DIAGNOSIS — F1021 Alcohol dependence, in remission: Secondary | ICD-10-CM | POA: Diagnosis not present

## 2023-05-26 DIAGNOSIS — F1991 Other psychoactive substance use, unspecified, in remission: Secondary | ICD-10-CM | POA: Diagnosis not present

## 2023-05-26 DIAGNOSIS — F4381 Prolonged grief disorder: Secondary | ICD-10-CM | POA: Diagnosis not present

## 2023-05-26 DIAGNOSIS — F4310 Post-traumatic stress disorder, unspecified: Secondary | ICD-10-CM | POA: Diagnosis not present

## 2023-06-02 DIAGNOSIS — F4381 Prolonged grief disorder: Secondary | ICD-10-CM | POA: Diagnosis not present

## 2023-06-02 DIAGNOSIS — F1021 Alcohol dependence, in remission: Secondary | ICD-10-CM | POA: Diagnosis not present

## 2023-06-02 DIAGNOSIS — F4310 Post-traumatic stress disorder, unspecified: Secondary | ICD-10-CM | POA: Diagnosis not present

## 2023-06-02 DIAGNOSIS — F1991 Other psychoactive substance use, unspecified, in remission: Secondary | ICD-10-CM | POA: Diagnosis not present

## 2023-06-09 DIAGNOSIS — F4310 Post-traumatic stress disorder, unspecified: Secondary | ICD-10-CM | POA: Diagnosis not present

## 2023-06-09 DIAGNOSIS — F1021 Alcohol dependence, in remission: Secondary | ICD-10-CM | POA: Diagnosis not present

## 2023-06-09 DIAGNOSIS — F1991 Other psychoactive substance use, unspecified, in remission: Secondary | ICD-10-CM | POA: Diagnosis not present

## 2023-06-09 DIAGNOSIS — F4381 Prolonged grief disorder: Secondary | ICD-10-CM | POA: Diagnosis not present

## 2023-06-14 DIAGNOSIS — Z1211 Encounter for screening for malignant neoplasm of colon: Secondary | ICD-10-CM | POA: Diagnosis not present

## 2023-06-14 DIAGNOSIS — K648 Other hemorrhoids: Secondary | ICD-10-CM | POA: Diagnosis not present

## 2023-06-17 DIAGNOSIS — F4310 Post-traumatic stress disorder, unspecified: Secondary | ICD-10-CM | POA: Diagnosis not present

## 2023-06-17 DIAGNOSIS — F1021 Alcohol dependence, in remission: Secondary | ICD-10-CM | POA: Diagnosis not present

## 2023-06-17 DIAGNOSIS — F1991 Other psychoactive substance use, unspecified, in remission: Secondary | ICD-10-CM | POA: Diagnosis not present

## 2023-06-17 DIAGNOSIS — F4381 Prolonged grief disorder: Secondary | ICD-10-CM | POA: Diagnosis not present

## 2023-06-28 DIAGNOSIS — F4381 Prolonged grief disorder: Secondary | ICD-10-CM | POA: Diagnosis not present

## 2023-06-28 DIAGNOSIS — F4310 Post-traumatic stress disorder, unspecified: Secondary | ICD-10-CM | POA: Diagnosis not present

## 2023-06-28 DIAGNOSIS — F1991 Other psychoactive substance use, unspecified, in remission: Secondary | ICD-10-CM | POA: Diagnosis not present

## 2023-06-28 DIAGNOSIS — F1021 Alcohol dependence, in remission: Secondary | ICD-10-CM | POA: Diagnosis not present

## 2023-07-07 DIAGNOSIS — F1991 Other psychoactive substance use, unspecified, in remission: Secondary | ICD-10-CM | POA: Diagnosis not present

## 2023-07-07 DIAGNOSIS — F4310 Post-traumatic stress disorder, unspecified: Secondary | ICD-10-CM | POA: Diagnosis not present

## 2023-07-07 DIAGNOSIS — F1021 Alcohol dependence, in remission: Secondary | ICD-10-CM | POA: Diagnosis not present

## 2023-07-07 DIAGNOSIS — F4381 Prolonged grief disorder: Secondary | ICD-10-CM | POA: Diagnosis not present

## 2023-07-14 DIAGNOSIS — F1021 Alcohol dependence, in remission: Secondary | ICD-10-CM | POA: Diagnosis not present

## 2023-07-14 DIAGNOSIS — F4310 Post-traumatic stress disorder, unspecified: Secondary | ICD-10-CM | POA: Diagnosis not present

## 2023-07-14 DIAGNOSIS — F4381 Prolonged grief disorder: Secondary | ICD-10-CM | POA: Diagnosis not present

## 2023-07-14 DIAGNOSIS — F1991 Other psychoactive substance use, unspecified, in remission: Secondary | ICD-10-CM | POA: Diagnosis not present

## 2023-07-20 DIAGNOSIS — Z6824 Body mass index (BMI) 24.0-24.9, adult: Secondary | ICD-10-CM | POA: Diagnosis not present

## 2023-07-20 DIAGNOSIS — K648 Other hemorrhoids: Secondary | ICD-10-CM | POA: Diagnosis not present

## 2023-07-20 DIAGNOSIS — R030 Elevated blood-pressure reading, without diagnosis of hypertension: Secondary | ICD-10-CM | POA: Diagnosis not present

## 2023-07-21 DIAGNOSIS — F4381 Prolonged grief disorder: Secondary | ICD-10-CM | POA: Diagnosis not present

## 2023-07-21 DIAGNOSIS — F4310 Post-traumatic stress disorder, unspecified: Secondary | ICD-10-CM | POA: Diagnosis not present

## 2023-07-21 DIAGNOSIS — F1991 Other psychoactive substance use, unspecified, in remission: Secondary | ICD-10-CM | POA: Diagnosis not present

## 2023-07-21 DIAGNOSIS — F1021 Alcohol dependence, in remission: Secondary | ICD-10-CM | POA: Diagnosis not present

## 2023-07-28 DIAGNOSIS — F4310 Post-traumatic stress disorder, unspecified: Secondary | ICD-10-CM | POA: Diagnosis not present

## 2023-07-28 DIAGNOSIS — F1991 Other psychoactive substance use, unspecified, in remission: Secondary | ICD-10-CM | POA: Diagnosis not present

## 2023-07-28 DIAGNOSIS — F4381 Prolonged grief disorder: Secondary | ICD-10-CM | POA: Diagnosis not present

## 2023-07-28 DIAGNOSIS — F1021 Alcohol dependence, in remission: Secondary | ICD-10-CM | POA: Diagnosis not present

## 2023-08-04 DIAGNOSIS — F1021 Alcohol dependence, in remission: Secondary | ICD-10-CM | POA: Diagnosis not present

## 2023-08-04 DIAGNOSIS — F4310 Post-traumatic stress disorder, unspecified: Secondary | ICD-10-CM | POA: Diagnosis not present

## 2023-08-04 DIAGNOSIS — F1991 Other psychoactive substance use, unspecified, in remission: Secondary | ICD-10-CM | POA: Diagnosis not present

## 2023-08-04 DIAGNOSIS — F4381 Prolonged grief disorder: Secondary | ICD-10-CM | POA: Diagnosis not present

## 2023-08-19 DIAGNOSIS — F4310 Post-traumatic stress disorder, unspecified: Secondary | ICD-10-CM | POA: Diagnosis not present

## 2023-08-19 DIAGNOSIS — F1991 Other psychoactive substance use, unspecified, in remission: Secondary | ICD-10-CM | POA: Diagnosis not present

## 2023-08-19 DIAGNOSIS — F4381 Prolonged grief disorder: Secondary | ICD-10-CM | POA: Diagnosis not present

## 2023-08-19 DIAGNOSIS — F1021 Alcohol dependence, in remission: Secondary | ICD-10-CM | POA: Diagnosis not present

## 2023-09-27 DIAGNOSIS — D235 Other benign neoplasm of skin of trunk: Secondary | ICD-10-CM | POA: Diagnosis not present

## 2023-12-30 DIAGNOSIS — F1021 Alcohol dependence, in remission: Secondary | ICD-10-CM | POA: Diagnosis not present

## 2023-12-30 DIAGNOSIS — F1991 Other psychoactive substance use, unspecified, in remission: Secondary | ICD-10-CM | POA: Diagnosis not present

## 2023-12-30 DIAGNOSIS — F4381 Prolonged grief disorder: Secondary | ICD-10-CM | POA: Diagnosis not present

## 2023-12-30 DIAGNOSIS — F4310 Post-traumatic stress disorder, unspecified: Secondary | ICD-10-CM | POA: Diagnosis not present

## 2024-01-20 DIAGNOSIS — F4381 Prolonged grief disorder: Secondary | ICD-10-CM | POA: Diagnosis not present

## 2024-01-20 DIAGNOSIS — F1991 Other psychoactive substance use, unspecified, in remission: Secondary | ICD-10-CM | POA: Diagnosis not present

## 2024-01-20 DIAGNOSIS — F4310 Post-traumatic stress disorder, unspecified: Secondary | ICD-10-CM | POA: Diagnosis not present

## 2024-01-20 DIAGNOSIS — F1021 Alcohol dependence, in remission: Secondary | ICD-10-CM | POA: Diagnosis not present

## 2024-02-10 DIAGNOSIS — F4310 Post-traumatic stress disorder, unspecified: Secondary | ICD-10-CM | POA: Diagnosis not present

## 2024-02-10 DIAGNOSIS — F4381 Prolonged grief disorder: Secondary | ICD-10-CM | POA: Diagnosis not present

## 2024-02-10 DIAGNOSIS — F1991 Other psychoactive substance use, unspecified, in remission: Secondary | ICD-10-CM | POA: Diagnosis not present

## 2024-02-10 DIAGNOSIS — F1021 Alcohol dependence, in remission: Secondary | ICD-10-CM | POA: Diagnosis not present

## 2024-03-02 DIAGNOSIS — F4310 Post-traumatic stress disorder, unspecified: Secondary | ICD-10-CM | POA: Diagnosis not present

## 2024-03-02 DIAGNOSIS — F1021 Alcohol dependence, in remission: Secondary | ICD-10-CM | POA: Diagnosis not present

## 2024-03-02 DIAGNOSIS — F1991 Other psychoactive substance use, unspecified, in remission: Secondary | ICD-10-CM | POA: Diagnosis not present

## 2024-03-02 DIAGNOSIS — F4381 Prolonged grief disorder: Secondary | ICD-10-CM | POA: Diagnosis not present

## 2024-03-30 DIAGNOSIS — F1021 Alcohol dependence, in remission: Secondary | ICD-10-CM | POA: Diagnosis not present

## 2024-03-30 DIAGNOSIS — F1991 Other psychoactive substance use, unspecified, in remission: Secondary | ICD-10-CM | POA: Diagnosis not present

## 2024-03-30 DIAGNOSIS — F4310 Post-traumatic stress disorder, unspecified: Secondary | ICD-10-CM | POA: Diagnosis not present

## 2024-03-30 DIAGNOSIS — F4381 Prolonged grief disorder: Secondary | ICD-10-CM | POA: Diagnosis not present

## 2024-04-07 DIAGNOSIS — Z23 Encounter for immunization: Secondary | ICD-10-CM | POA: Diagnosis not present

## 2024-04-25 DIAGNOSIS — F4310 Post-traumatic stress disorder, unspecified: Secondary | ICD-10-CM | POA: Diagnosis not present

## 2024-04-25 DIAGNOSIS — F1991 Other psychoactive substance use, unspecified, in remission: Secondary | ICD-10-CM | POA: Diagnosis not present

## 2024-04-25 DIAGNOSIS — F4381 Prolonged grief disorder: Secondary | ICD-10-CM | POA: Diagnosis not present

## 2024-04-25 DIAGNOSIS — F1021 Alcohol dependence, in remission: Secondary | ICD-10-CM | POA: Diagnosis not present

## 2024-05-09 DIAGNOSIS — F4381 Prolonged grief disorder: Secondary | ICD-10-CM | POA: Diagnosis not present

## 2024-05-09 DIAGNOSIS — F1021 Alcohol dependence, in remission: Secondary | ICD-10-CM | POA: Diagnosis not present

## 2024-05-09 DIAGNOSIS — F1991 Other psychoactive substance use, unspecified, in remission: Secondary | ICD-10-CM | POA: Diagnosis not present

## 2024-05-09 DIAGNOSIS — F4310 Post-traumatic stress disorder, unspecified: Secondary | ICD-10-CM | POA: Diagnosis not present

## 2024-05-23 DIAGNOSIS — F1991 Other psychoactive substance use, unspecified, in remission: Secondary | ICD-10-CM | POA: Diagnosis not present

## 2024-05-23 DIAGNOSIS — F1021 Alcohol dependence, in remission: Secondary | ICD-10-CM | POA: Diagnosis not present

## 2024-05-23 DIAGNOSIS — F4381 Prolonged grief disorder: Secondary | ICD-10-CM | POA: Diagnosis not present

## 2024-05-23 DIAGNOSIS — F4310 Post-traumatic stress disorder, unspecified: Secondary | ICD-10-CM | POA: Diagnosis not present

## 2024-05-26 ENCOUNTER — Ambulatory Visit (HOSPITAL_BASED_OUTPATIENT_CLINIC_OR_DEPARTMENT_OTHER): Admission: EM | Admit: 2024-05-26 | Discharge: 2024-05-26 | Disposition: A | Discharge status: Home / Self Care

## 2024-05-26 ENCOUNTER — Encounter (HOSPITAL_BASED_OUTPATIENT_CLINIC_OR_DEPARTMENT_OTHER): Payer: Self-pay

## 2024-05-26 DIAGNOSIS — R197 Diarrhea, unspecified: Secondary | ICD-10-CM | POA: Diagnosis not present

## 2024-05-26 NOTE — ED Triage Notes (Addendum)
 Pt c/o body aches headache diarrhea nausea vomiting loss of appetite abdominal pain dizziness x 4 days. Denies fever,flu/cold symptoms. Pt has taken been ibuprofen tylenol pepto bismo for symptom.

## 2024-05-26 NOTE — ED Provider Notes (Signed)
 [facility]    CSN: [id number] Arrival date & time: 05/26/24  1603      History   Chief Complaint Chief Complaint  Patient presents with   Diarrhea    HPI Shawn Hutchinson is a 48 y.o. male.   Pt is a 48 year old male that presents with body aches, headache, diarrhea, nausea, vomiting, loss of appetite, abdominal pain, dizziness x 4 days. Abdominal pain is generalized and described as cramping.  Denies fever,flu/cold symptoms. Pt has been  ibuprofen, tylenol, and  pepto bismol for symptom. Reports eating at a restaurant that was questionable with roaches on the floor prior to this starting.     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   Onset some 2 months prior to today's examination with flashing lights and floaters right eye   Generalized anxiety disorder 09/22/2014   Lumbar radiculopathy 01/14/2021   MDD (major depressive disorder), recurrent severe, 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   Mixed hyperlipidemia 05/20/2016   Musculoskeletal neck pain 01/25/2020   Myelopathy in diseases classified elsewhere (HCC) 02/29/2020   Non-recurrent bilateral inguinal hernia without obstruction or gangrene 05/04/2019   Pulmonary nodule    Right   Spinal stenosis in cervical region 02/25/2021   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 Lynwood Search III   HERNIA REPAIR Left 08/2020   HIP SURGERY Right 2000   Reconstruction       Home Medications    Prior to Admission medications   Medication Sig Start Date End Date Taking? Authorizing Provider  chlordiazePOXIDE (LIBRIUM) 5 MG capsule Take 5 mg by mouth 3 (three) times daily as needed for anxiety.    [provider]  cholecalciferol (VITAMIN D3) 25 MCG (1000 UNIT) tablet Take 1,000 Units by mouth daily.    [provider]  clobetasol (TEMOVATE) 0.05 % external solution Apply 1 application topically as needed for itching. 10/02/20   [provider]  cloNIDine (CATAPRES) 0.1 MG tablet Take 0.1 mg by mouth 3 (three) times daily. 11/02/21   [provider]  disulfiram (ANTABUSE) 250 MG tablet Take 250 mg by mouth daily.    [provider]  fluconazole (DIFLUCAN) 150 MG tablet Take 150 mg by mouth once a week. 06/06/21   [provider]  folic acid (FOLATE) 400 MCG tablet Take 400 mcg by mouth daily.    [provider]  hydrocortisone 2.5 % ointment Apply 1 application topically as needed for itching. 10/02/20   [provider]  hydrOXYzine (ATARAX/VISTARIL) 25 MG tablet Take 25-50 mg by mouth 3 (three) times daily as needed for itching. 10/01/20   [provider]  ketoconazole (NIZORAL) 2 % shampoo Apply 1 application. topically 2 (two) times a week. 10/27/21   [provider]  levothyroxine (SYNTHROID) 150 MCG tablet Take 150 mcg by mouth daily before breakfast.    [provider]  Multiple Vitamins-Minerals (MULTIVITAMIN WITH MINERALS) tablet Take 1 tablet by mouth daily.    [provider]  oxazepam (SERAX) 10 MG capsule Take 10 mg by mouth 2 (two) times daily.    [provider]  thiamine 100 MG tablet Take 100 mg by mouth daily.    [provider]  traZODone (DESYREL) 100 MG tablet Take 150 mg by mouth at bedtime. 11/25/20   [provider]    Family History Family History  Problem Relation Age of Onset   Alcohol  abuse Mother    Alcohol  abuse Maternal Grandmother    Alcohol  abuse Maternal Grandfather    Pancreatic cancer Other     Social History Social History   Tobacco Use   Smoking status: Never   Smokeless tobacco: Former  Substance Use Topics   Alcohol  use: Not Currently    Comment: Rare     Allergies   Patient has no known allergies.   Review of Systems Review of Systems  Gastrointestinal:  Positive for diarrhea.     Physical Exam Triage Vital Signs ED Triage Vitals  Encounter Vitals Group     BP 05/26/24 1714 121/77     Girls Systolic BP Percentile --      Girls Diastolic BP Percentile --      Boys  Systolic BP Percentile --      Boys Diastolic BP Percentile --      Pulse Rate 05/26/24 1714 72     Resp 05/26/24 1714 18     Temp 05/26/24 1714 98 F (36.7 C)     Temp Source 05/26/24 1714 Oral     SpO2 05/26/24 1714 96 %     Weight --      Height --      Head Circumference --      Peak Flow --      Pain Score 05/26/24 1712 2     Pain Loc --      Pain Education --      Exclude from Growth Chart --    No data found.  Updated Vital Signs BP 121/77 (BP Location: Right Arm)   Pulse 72   Temp 98 F (36.7 C) (Oral)   Resp 18   SpO2 96%   Visual Acuity Right Eye Distance:  Left Eye Distance:   Bilateral Distance:    Right Eye Near:   Left Eye Near:    Bilateral Near:     Physical Exam Constitutional:      General: He is not in acute distress.    Appearance: Normal appearance. He is not ill-appearing, toxic-appearing or diaphoretic.  Pulmonary:     Effort: Pulmonary effort is normal.  Musculoskeletal:        General: Normal range of motion.  Neurological:     Mental Status: He is alert.  Psychiatric:        Mood and Affect: Mood normal.      UC Treatments / Results  Labs (all labs ordered are listed, but only abnormal results are displayed) Labs Reviewed - No data to display  EKG   Radiology No results found.  Procedures Procedures (including critical care time)  Medications Ordered in UC Medications - No data to display  Initial Impression / Assessment and Plan / UC Course  I have reviewed the triage vital signs and the nursing notes.  Pertinent labs & imaging results that were available during my care of the patient were reviewed by me and considered in my medical decision making (see chart for details).     Diarrhea-patient with onset of diarrhea for approximately 4 days after possibly eating something questionable at a office dep[employer].  This most likely is the cause versus some viral illness.  He does not appear to be dehydrated this  time.  Offered to get stool sample to test but patient refused.  Patient wanting something to stop the diarrhea.  Recommended Imodium over-the-counter.  Rest, hydrate with electrolyte fluids to avoid dehydration.  If the diarrhea persists he will need to get this tested or follow-up with primary care.  Patient understanding and agree. Final Clinical Impressions(s) / UC Diagnoses   Final diagnoses:  Diarrhea of presumed infectious origin     Discharge Instructions      Please return for stool sample if the diarrhea persist.  Recommend electrolyte fluids for hydration.  Follow-up as needed     ED Prescriptions   None    PDMP not reviewed this encounter.   Adah Wilbert LABOR, FNP 05/27/24 [phone]

## 2024-05-27 NOTE — Discharge Instructions (Signed)
 Please return for stool sample if the diarrhea persist.  Recommend electrolyte fluids for hydration.  Follow-up as needed

## 2024-06-06 DIAGNOSIS — F4310 Post-traumatic stress disorder, unspecified: Secondary | ICD-10-CM | POA: Diagnosis not present

## 2024-06-06 DIAGNOSIS — F1021 Alcohol dependence, in remission: Secondary | ICD-10-CM | POA: Diagnosis not present

## 2024-06-06 DIAGNOSIS — F4381 Prolonged grief disorder: Secondary | ICD-10-CM | POA: Diagnosis not present

## 2024-06-06 DIAGNOSIS — F1991 Other psychoactive substance use, unspecified, in remission: Secondary | ICD-10-CM | POA: Diagnosis not present

## 2024-06-20 DIAGNOSIS — F4310 Post-traumatic stress disorder, unspecified: Secondary | ICD-10-CM | POA: Diagnosis not present

## 2024-06-20 DIAGNOSIS — F4381 Prolonged grief disorder: Secondary | ICD-10-CM | POA: Diagnosis not present

## 2024-06-20 DIAGNOSIS — F1991 Other psychoactive substance use, unspecified, in remission: Secondary | ICD-10-CM | POA: Diagnosis not present

## 2024-06-20 DIAGNOSIS — F1021 Alcohol dependence, in remission: Secondary | ICD-10-CM | POA: Diagnosis not present
